# Patient Record
Sex: Female | Born: 1960 | Race: Black or African American | Hispanic: No | Marital: Married | Smoking: Never smoker
Health system: Southern US, Community
[De-identification: ages and names within clinical notes are randomized; demographics above are authoritative.]

## PROBLEM LIST (undated history)

## (undated) DIAGNOSIS — R011 Cardiac murmur, unspecified: Secondary | ICD-10-CM

## (undated) DIAGNOSIS — M1990 Unspecified osteoarthritis, unspecified site: Secondary | ICD-10-CM

## (undated) DIAGNOSIS — E079 Disorder of thyroid, unspecified: Secondary | ICD-10-CM

## (undated) DIAGNOSIS — G500 Trigeminal neuralgia: Secondary | ICD-10-CM

## (undated) DIAGNOSIS — F419 Anxiety disorder, unspecified: Secondary | ICD-10-CM

## (undated) DIAGNOSIS — E039 Hypothyroidism, unspecified: Secondary | ICD-10-CM

## (undated) DIAGNOSIS — G2581 Restless legs syndrome: Secondary | ICD-10-CM

## (undated) DIAGNOSIS — M549 Dorsalgia, unspecified: Secondary | ICD-10-CM

## (undated) HISTORY — PX: COLONOSCOPY: SHX174

## (undated) HISTORY — PX: BACK SURGERY: SHX140

## (undated) HISTORY — PX: SPINAL CORD STIMULATOR INSERTION: SHX5378

## (undated) HISTORY — DX: Unspecified osteoarthritis, unspecified site: M19.90

## (undated) HISTORY — PX: VAGINAL HYSTERECTOMY: SHX2639

---

## 2009-12-18 ENCOUNTER — Emergency Department (HOSPITAL_COMMUNITY): Admission: EM | Admit: 2009-12-18 | Discharge: 2009-12-19 | Payer: Self-pay | Admitting: Emergency Medicine

## 2010-01-23 ENCOUNTER — Emergency Department (HOSPITAL_COMMUNITY): Admission: EM | Admit: 2010-01-23 | Discharge: 2010-01-23 | Payer: Self-pay | Admitting: Emergency Medicine

## 2010-03-16 ENCOUNTER — Emergency Department (HOSPITAL_COMMUNITY)
Admission: EM | Admit: 2010-03-16 | Discharge: 2010-03-16 | Payer: Self-pay | Source: Home / Self Care | Admitting: Emergency Medicine

## 2010-06-27 ENCOUNTER — Emergency Department (HOSPITAL_COMMUNITY)
Admission: EM | Admit: 2010-06-27 | Discharge: 2010-06-27 | Disposition: A | Discharge status: Home / Self Care | Payer: Medicare HMO | Attending: Emergency Medicine | Admitting: Emergency Medicine

## 2010-06-27 DIAGNOSIS — Z79899 Other long term (current) drug therapy: Secondary | ICD-10-CM | POA: Insufficient documentation

## 2010-06-27 DIAGNOSIS — N898 Other specified noninflammatory disorders of vagina: Secondary | ICD-10-CM | POA: Insufficient documentation

## 2010-06-27 DIAGNOSIS — G8929 Other chronic pain: Secondary | ICD-10-CM | POA: Insufficient documentation

## 2010-06-27 LAB — URINALYSIS, ROUTINE W REFLEX MICROSCOPIC
Bilirubin Urine: NEGATIVE
Glucose, UA: NEGATIVE mg/dL
Nitrite: NEGATIVE
Specific Gravity, Urine: 1.034 — ABNORMAL HIGH (ref 1.005–1.030)
pH: 5.5 (ref 5.0–8.0)

## 2010-06-27 LAB — WET PREP, GENITAL
Clue Cells Wet Prep HPF POC: NONE SEEN
Trich, Wet Prep: NONE SEEN
WBC, Wet Prep HPF POC: NONE SEEN

## 2010-07-01 LAB — GC/CHLAMYDIA PROBE AMP, GENITAL: Chlamydia, DNA Probe: NEGATIVE

## 2010-12-14 ENCOUNTER — Emergency Department (HOSPITAL_COMMUNITY)
Admission: EM | Admit: 2010-12-14 | Discharge: 2010-12-14 | Disposition: A | Discharge status: Home / Self Care | Payer: Medicare HMO | Attending: Emergency Medicine | Admitting: Emergency Medicine

## 2010-12-14 DIAGNOSIS — M79609 Pain in unspecified limb: Secondary | ICD-10-CM | POA: Insufficient documentation

## 2010-12-14 DIAGNOSIS — M545 Low back pain, unspecified: Secondary | ICD-10-CM | POA: Insufficient documentation

## 2011-11-12 ENCOUNTER — Emergency Department (HOSPITAL_COMMUNITY)
Admission: EM | Admit: 2011-11-12 | Discharge: 2011-11-12 | Disposition: A | Discharge status: Home / Self Care | Payer: Medicare HMO | Attending: Emergency Medicine | Admitting: Emergency Medicine

## 2011-11-12 ENCOUNTER — Encounter (HOSPITAL_COMMUNITY): Payer: Self-pay | Admitting: Family Medicine

## 2011-11-12 DIAGNOSIS — E079 Disorder of thyroid, unspecified: Secondary | ICD-10-CM | POA: Insufficient documentation

## 2011-11-12 DIAGNOSIS — M549 Dorsalgia, unspecified: Secondary | ICD-10-CM | POA: Insufficient documentation

## 2011-11-12 DIAGNOSIS — G8929 Other chronic pain: Secondary | ICD-10-CM | POA: Insufficient documentation

## 2011-11-12 HISTORY — DX: Dorsalgia, unspecified: M54.9

## 2011-11-12 HISTORY — DX: Trigeminal neuralgia: G50.0

## 2011-11-12 HISTORY — DX: Disorder of thyroid, unspecified: E07.9

## 2011-11-12 MED ORDER — METHYLPREDNISOLONE SODIUM SUCC 125 MG IJ SOLR
125.0000 mg | Freq: Once | INTRAMUSCULAR | Status: AC
  Administered 2011-11-12: 125 mg via INTRAVENOUS
  Filled 2011-11-12: qty 2

## 2011-11-12 MED ORDER — ONDANSETRON 4 MG PO TBDP
4.0000 mg | ORAL_TABLET | Freq: Once | ORAL | Status: AC
  Administered 2011-11-12: 4 mg via ORAL
  Filled 2011-11-12: qty 1

## 2011-11-12 MED ORDER — HYDROMORPHONE HCL PF 1 MG/ML IJ SOLN
INTRAMUSCULAR | Status: AC
  Administered 2011-11-12: 1 mg via INTRAVENOUS
  Filled 2011-11-12: qty 1

## 2011-11-12 MED ORDER — MORPHINE SULFATE 4 MG/ML IJ SOLN
4.0000 mg | Freq: Once | INTRAMUSCULAR | Status: AC
  Administered 2011-11-12: 4 mg via INTRAMUSCULAR
  Filled 2011-11-12: qty 1

## 2011-11-12 MED ORDER — PREDNISONE 20 MG PO TABS: 40.0000 mg | ORAL_TABLET | Freq: Every day | ORAL | Status: DC

## 2011-11-12 MED ORDER — OXYCODONE-ACETAMINOPHEN 5-325 MG PO TABS: 1.0000 | ORAL_TABLET | Freq: Four times a day (QID) | ORAL | Status: AC | PRN

## 2011-11-12 MED ORDER — HYDROMORPHONE HCL PF 1 MG/ML IJ SOLN
1.0000 mg | Freq: Once | INTRAMUSCULAR | Status: AC
  Administered 2011-11-12: 1 mg via INTRAVENOUS

## 2011-11-12 NOTE — ED Notes (Signed)
Pt reports she has back stimulator. States she just moved here from [place].  States usually when she gets the pain to right lower back she goes to her doctor and he gives her a steroid shot and she is good, but has not been able to make it up there this time. States pain has been getting worse over the past 2 weeks.

## 2011-11-12 NOTE — ED Notes (Signed)
MD at bedside. 

## 2011-11-12 NOTE — ED Provider Notes (Signed)
History     CSN: [id number]  Arrival date & time 11/12/11  1507   First MD Initiated Contact with Patient 11/12/11 1828      Chief Complaint  Patient presents with  . Back Pain    (Consider location/radiation/quality/duration/timing/severity/associated sxs/prior treatment) HPI  Patient to the ER for back pain. She tells me that has chronic back pain due to an injury on Sept 11, 2001 as she was in the [facility] when it collapsed. She was unable to walk until after back surgery was performed. Within the past couple of years she has gotten a stimulator implant. Every 6 months she gets a steroid injection in her back. Normally they last 6 months but this time it only has lasted 4 months, She is unable to drive to [place] to see her orthopedic doctor as she is in too much pain to drive. She denies this episode of pain being worse than episodes she has had before, however, she has has been stuck at home and has not been ableto get her normal treatments. She denies nausea, vomiting, dysuria, vaginal bleedings or discharge, weakness, fevers chills and IV drug use.  Past Medical History  Diagnosis Date  . Trigeminal neuralgia   . Thyroid disease   . Back pain     Past Surgical History  Procedure Date  . Back surgery     History reviewed. No pertinent family history.  History  Substance Use Topics  . Smoking status: Never Smoker   . Smokeless tobacco: Not on file  . Alcohol Use: No    OB History    Grav Para Term Preterm Abortions TAB SAB Ect Mult Living                  Review of Systems  Unable to perform ROS   Allergies  Review of patient's allergies indicates no known allergies.  Home Medications   Current Outpatient Rx  Name Route Sig Dispense Refill  . CARBAMAZEPINE 200 MG PO TABS Oral Take 200 mg by mouth 2 (two) times daily.    Marland Kitchen GABAPENTIN 600 MG PO TABS Oral Take 600 mg by mouth 3 (three) times daily.    Marland Kitchen LEVOTHYROXINE SODIUM 25 MCG PO TABS Oral Take 25  mcg by mouth daily.    . ADULT MULTIVITAMIN W/MINERALS CH Oral Take 1 tablet by mouth daily.    Marland Kitchen PREGABALIN 100 MG PO CAPS Oral Take 100 mg by mouth 2 (two) times daily as needed. For pain.    . OXYCODONE-ACETAMINOPHEN 5-325 MG PO TABS Oral Take 1-2 tablets by mouth every 6 (six) hours as needed for pain. 15 tablet 0  . PREDNISONE 20 MG PO TABS Oral Take 2 tablets (40 mg total) by mouth daily. 14 tablet 0    BP 107/71  Pulse 69  Temp 98.2 F (36.8 C) (Oral)  Resp 18  SpO2 100%  Physical Exam  Nursing note and vitals reviewed. Constitutional: She appears well-developed and well-nourished. No distress.       Pt appears to be in significant pain  HENT:  Head: Normocephalic and atraumatic.  Eyes: Pupils are equal, round, and reactive to light.  Neck: Normal range of motion. Neck supple.  Cardiovascular: Normal rate and regular rhythm.   Pulmonary/Chest: Effort normal.  Abdominal: Soft.  Musculoskeletal:        Equal strength to bilateral lower extremities. Neurosensory function adequate to both legs. Skin color is normal. Skin is warm and moist. I see no  step off deformity, no bony tenderness. Pt is able to ambulate. Pain is relieved when sitting in certain positions. ROM is decreased due to pain. No crepitus, laceration, effusion, swelling.  Pulses are normal   Neurological: She is alert.  Skin: Skin is warm and dry.    ED Course  Procedures (including critical care time)  Labs Reviewed - No data to display No results found.   1. Chronic back pain       MDM  Pts orthopedist in [place], i have given referral for doctor in [place] as she lives [place] now.   Patient with back pain. No neurological deficits. Patient is ambulatory. No warning symptoms of back pain including: loss of bowel or bladder control, night sweats, waking from sleep with back pain, unexplained fevers or weight loss, h/o cancer, IVDU, no recent trauma. No concern for cauda equina, epidural abscess, or  other serious cause of back pain. Conservative measures such as rest, ice/heat and pain medicine indicated with PCP follow-up if no improvement with conservative management.            Dorthula Matas, PA 11/24/11 2052

## 2011-11-12 NOTE — ED Notes (Signed)
Family at bedside. 

## 2011-11-25 NOTE — ED Provider Notes (Signed)
Medical screening examination/treatment/procedure(s) were performed by non-physician practitioner and as supervising physician I was immediately available for consultation/collaboration.    Ronna Herskowitz L Keywon Mestre, MD 11/25/11 2251 

## 2012-10-25 ENCOUNTER — Other Ambulatory Visit: Payer: Self-pay | Admitting: *Deleted

## 2012-10-25 ENCOUNTER — Other Ambulatory Visit: Payer: Self-pay | Admitting: Gynecology

## 2012-10-25 DIAGNOSIS — M810 Age-related osteoporosis without current pathological fracture: Secondary | ICD-10-CM

## 2012-11-01 ENCOUNTER — Other Ambulatory Visit: Payer: Self-pay | Admitting: Gynecology

## 2012-11-01 DIAGNOSIS — M810 Age-related osteoporosis without current pathological fracture: Secondary | ICD-10-CM

## 2012-11-02 ENCOUNTER — Ambulatory Visit
Admission: RE | Admit: 2012-11-02 | Discharge: 2012-11-02 | Disposition: A | Discharge status: Home / Self Care | Payer: Private Health Insurance - Indemnity | Source: Ambulatory Visit | Attending: *Deleted | Admitting: *Deleted

## 2012-11-02 ENCOUNTER — Other Ambulatory Visit: Payer: Self-pay | Admitting: *Deleted

## 2012-11-02 DIAGNOSIS — M810 Age-related osteoporosis without current pathological fracture: Secondary | ICD-10-CM

## 2013-04-25 ENCOUNTER — Emergency Department (HOSPITAL_COMMUNITY)
Admission: EM | Admit: 2013-04-25 | Discharge: 2013-04-25 | Disposition: A | Discharge status: Home / Self Care | Payer: BC Managed Care – PPO | Attending: Emergency Medicine | Admitting: Emergency Medicine

## 2013-04-25 ENCOUNTER — Emergency Department (HOSPITAL_COMMUNITY): Payer: BC Managed Care – PPO

## 2013-04-25 ENCOUNTER — Encounter (HOSPITAL_COMMUNITY): Payer: Self-pay | Admitting: Emergency Medicine

## 2013-04-25 DIAGNOSIS — Z791 Long term (current) use of non-steroidal anti-inflammatories (NSAID): Secondary | ICD-10-CM | POA: Insufficient documentation

## 2013-04-25 DIAGNOSIS — E079 Disorder of thyroid, unspecified: Secondary | ICD-10-CM | POA: Insufficient documentation

## 2013-04-25 DIAGNOSIS — R05 Cough: Secondary | ICD-10-CM | POA: Insufficient documentation

## 2013-04-25 DIAGNOSIS — Z79899 Other long term (current) drug therapy: Secondary | ICD-10-CM | POA: Insufficient documentation

## 2013-04-25 DIAGNOSIS — G8929 Other chronic pain: Secondary | ICD-10-CM | POA: Insufficient documentation

## 2013-04-25 DIAGNOSIS — M5430 Sciatica, unspecified side: Secondary | ICD-10-CM | POA: Insufficient documentation

## 2013-04-25 DIAGNOSIS — Z9889 Other specified postprocedural states: Secondary | ICD-10-CM | POA: Insufficient documentation

## 2013-04-25 DIAGNOSIS — R059 Cough, unspecified: Secondary | ICD-10-CM | POA: Insufficient documentation

## 2013-04-25 DIAGNOSIS — J3489 Other specified disorders of nose and nasal sinuses: Secondary | ICD-10-CM | POA: Insufficient documentation

## 2013-04-25 MED ORDER — OXYCODONE-ACETAMINOPHEN 5-325 MG PO TABS
2.0000 | ORAL_TABLET | Freq: Once | ORAL | Status: AC
  Administered 2013-04-25: 2 via ORAL
  Filled 2013-04-25: qty 2

## 2013-04-25 MED ORDER — HYDROMORPHONE HCL PF 1 MG/ML IJ SOLN
1.0000 mg | Freq: Once | INTRAMUSCULAR | Status: AC
  Administered 2013-04-25: 1 mg via INTRAVENOUS
  Filled 2013-04-25: qty 1

## 2013-04-25 MED ORDER — OXYCODONE-ACETAMINOPHEN 5-325 MG PO TABS
1.0000 | ORAL_TABLET | ORAL | Status: DC | PRN
Start: 2013-04-25 — End: 2013-06-08

## 2013-04-25 MED ORDER — HYDROMORPHONE HCL PF 1 MG/ML IJ SOLN: 1.0000 mg | Freq: Once | INTRAMUSCULAR | Status: DC

## 2013-04-25 MED ORDER — ORPHENADRINE CITRATE ER 100 MG PO TB12
100.0000 mg | ORAL_TABLET | Freq: Once | ORAL | Status: AC
  Administered 2013-04-25: 100 mg via ORAL
  Filled 2013-04-25: qty 1

## 2013-04-25 MED ORDER — KETOROLAC TROMETHAMINE 30 MG/ML IJ SOLN
30.0000 mg | Freq: Once | INTRAMUSCULAR | Status: AC
  Administered 2013-04-25: 30 mg via INTRAVENOUS
  Filled 2013-04-25: qty 1

## 2013-04-25 NOTE — ED Provider Notes (Signed)
CSN: [id number]     Arrival date & time 04/25/13  1131 History   First MD Initiated Contact with Patient 04/25/13 1143     Chief Complaint  Patient presents with  . Back Pain   (Consider location/radiation/quality/duration/timing/severity/associated sxs/prior Treatment) HPI Comments: Hx of back stimulator for chronic back pain, has had several months of R hip/groin pain she reports getting steroid injections for.    Patient is a 53 y.o. female presenting with hip pain and URI. The history is provided by the patient and the spouse. No language interpreter was used.  Hip Pain This is a chronic problem. The current episode started more than 1 week ago (about 6 months, worse for 3 days). The problem occurs constantly. The problem has been gradually worsening. Pertinent negatives include no chest pain, no abdominal pain, no headaches and no shortness of breath. The symptoms are aggravated by walking, standing and bending. The symptoms are relieved by rest and lying down. Treatments tried: gabapentin, naproxen. The treatment provided mild relief.  URI Presenting symptoms: congestion, cough and rhinorrhea   Presenting symptoms: no fatigue, no fever and no sore throat   Congestion:    Location:  Nasal   Interferes with sleep: no     Interferes with eating/drinking: no   Cough:    Cough characteristics:  Non-productive   Severity:  Mild Rhinorrhea:    Quality:  Clear Severity:  Mild Duration:  3 days Timing:  Constant Progression:  Unchanged Chronicity:  New Relieved by:  Nothing Worsened by:  Nothing tried Associated symptoms: myalgias   Associated symptoms: no arthralgias, no headaches and no neck pain   Risk factors: sick contacts (with "allergy"symptoms)     Past Medical History  Diagnosis Date  . Trigeminal neuralgia   . Thyroid disease   . Back pain    Past Surgical History  Procedure Laterality Date  . Back surgery     No family history on file. History  Substance Use  Topics  . Smoking status: Never Smoker   . Smokeless tobacco: Not on file  . Alcohol Use: No   OB History   Grav Para Term Preterm Abortions TAB SAB Ect Mult Living                 Review of Systems  Constitutional: Negative for fever, chills, diaphoresis, activity change, appetite change and fatigue.  HENT: Positive for congestion and rhinorrhea. Negative for facial swelling and sore throat.   Eyes: Negative for photophobia and discharge.  Respiratory: Positive for cough. Negative for chest tightness and shortness of breath.   Cardiovascular: Negative for chest pain, palpitations and leg swelling.  Gastrointestinal: Negative for nausea, vomiting, abdominal pain and diarrhea.  Endocrine: Negative for polydipsia and polyuria.  Genitourinary: Negative for dysuria, frequency, difficulty urinating and pelvic pain.  Musculoskeletal: Positive for myalgias. Negative for arthralgias, back pain, neck pain and neck stiffness.  Skin: Negative for color change and wound.  Allergic/Immunologic: Negative for immunocompromised state.  Neurological: Positive for numbness (unchanged from chronic). Negative for facial asymmetry, weakness and headaches.  Hematological: Does not bruise/bleed easily.  Psychiatric/Behavioral: Negative for confusion and agitation.    Allergies  Dilaudid  Home Medications   Current Outpatient Rx  Name  Route  Sig  Dispense  Refill  . carbamazepine (TEGRETOL) 200 MG tablet   Oral   Take 200 mg by mouth 2 (two) times daily.         Marland Kitchen gabapentin (NEURONTIN) 600 MG tablet  Oral   Take 1,200 mg by mouth 3 (three) times daily.          Marland Kitchen. levothyroxine (SYNTHROID, LEVOTHROID) 50 MCG tablet   Oral   Take 50 mcg by mouth daily before breakfast.         . Multiple Vitamin (MULTIVITAMIN WITH MINERALS) TABS   Oral   Take 1 tablet by mouth daily.         . nabumetone (RELAFEN) 750 MG tablet   Oral   Take 750 mg by mouth 2 (two) times daily.         Marland Kitchen.  oxyCODONE-acetaminophen (PERCOCET) 5-325 MG per tablet   Oral   Take 1 tablet by mouth every 4 (four) hours as needed.   15 tablet   0   . pregabalin (LYRICA) 100 MG capsule   Oral   Take 100 mg by mouth 2 (two) times daily. For pain.          BP 114/67  Pulse 57  Temp(Src) 98.2 F (36.8 C) (Oral)  Resp 18  Wt 170 lb (77.111 kg)  SpO2 100% Physical Exam  Constitutional: She is oriented to person, place, and time. She appears well-developed and well-nourished. No distress.  HENT:  Head: Normocephalic and atraumatic.  Mouth/Throat: No oropharyngeal exudate.  Eyes: Pupils are equal, round, and reactive to light.  Neck: Normal range of motion. Neck supple.  Cardiovascular: Normal rate, regular rhythm and normal heart sounds.  Exam reveals no gallop and no friction rub.   No murmur heard. Pulmonary/Chest: Effort normal and breath sounds normal. No respiratory distress. She has no wheezes. She has no rales.  Abdominal: Soft. Bowel sounds are normal. She exhibits no distension and no mass. There is no tenderness. There is no rebound and no guarding.  Musculoskeletal: Normal range of motion. She exhibits no edema.       Right hip: She exhibits tenderness and bony tenderness. She exhibits normal range of motion, normal strength, no swelling, no deformity and no laceration.  Neurological: She is alert and oriented to person, place, and time. She has normal strength. She displays no tremor. A sensory deficit (chronic unchanged decreased sensation of L foot) is present. No cranial nerve deficit. She exhibits normal muscle tone. Coordination normal. GCS eye subscore is 4. GCS verbal subscore is 5. GCS motor subscore is 6.  Skin: Skin is warm and dry.  Psychiatric: She has a normal mood and affect.    ED Course  Procedures (including critical care time) Labs Review Labs Reviewed - No data to display Imaging Review Dg Hip Complete Right  04/25/2013   CLINICAL DATA:  Right hip pain  EXAM:  RIGHT HIP - COMPLETE 2+ VIEW  COMPARISON:  None.  FINDINGS: Three views of the right hip submitted. No acute fracture or subluxation. Joint space is preserved.  IMPRESSION: Negative.   Electronically Signed   By: Natasha MeadLiviu  Pop M.D.   On: 04/25/2013 13:17    EKG Interpretation   None       MDM   1. Sciatica    Pt is a 53 y.o. female with Pmhx as above including stimulator implant for chronic back pain, who presents with recurrent R hip pain radiating down L leg, worse w/ standing, mvmt of hip, better when still. Denies weakness, new numbness from baseline.  Has also had subjective fever, chills, body aches, cough, rhinorrhea.  She reports swelling at the hip.  No bowel or bladder incontinence.  On PE, VSS, afebrile,  in NAD.  +ttp over L hip and lateral L thigh, pain with passive & active ROM, but no weakness.  +straight leg raise. Chronic, unchanged dec sensation L foot.  Ordered XR R hip which was nml, including joint space.  Suspect sciatica based on PE.  Pt may also have mild URI or influenza-like illness, but again is afebrile and non-toxic. Doubt cauda equina, cord compression, spinal epidural abscess or acute septic arthritis.  Despite appearing to have moved here years ago, pt still has not established w/ local neurosurgeon.  She feels much improved after IV dilaudid x1, and 2 PO percocet.  Will d/c home w/ rec for outpt NSU, PCP f/u.  Return precautions given for new or worsening symptoms including fever, numbness, weakness, bowel or bladder incontinence.          Shanna Cisco, MD 04/25/13 2013

## 2013-04-25 NOTE — ED Notes (Signed)
Has a spinal cord stimulator and now has rt hip pain that rads  Pelvis hurts to bear wt shooting pain has a pain med dr but has not gotten reg dr yet

## 2013-04-25 NOTE — Discharge Instructions (Signed)
Sciatica °Sciatica is pain, weakness, numbness, or tingling along the path of the sciatic nerve. The nerve starts in the lower back and runs down the back of each leg. The nerve controls the muscles in the lower leg and in the back of the knee, while also providing sensation to the back of the thigh, lower leg, and the sole of your foot. Sciatica is a symptom of another medical condition. For instance, nerve damage or certain conditions, such as a herniated disk or bone spur on the spine, pinch or put pressure on the sciatic nerve. This causes the pain, weakness, or other sensations normally associated with sciatica. Generally, sciatica only affects one side of the body. °CAUSES  °· Herniated or slipped disc. °· Degenerative disk disease. °· A pain disorder involving the narrow muscle in the buttocks (piriformis syndrome). °· Pelvic injury or fracture. °· Pregnancy. °· Tumor (rare). °SYMPTOMS  °Symptoms can vary from mild to very severe. The symptoms usually travel from the low back to the buttocks and down the back of the leg. Symptoms can include: °· Mild tingling or dull aches in the lower back, leg, or hip. °· Numbness in the back of the calf or sole of the foot. °· Burning sensations in the lower back, leg, or hip. °· Sharp pains in the lower back, leg, or hip. °· Leg weakness. °· Severe back pain inhibiting movement. °These symptoms may get worse with coughing, sneezing, laughing, or prolonged sitting or standing. Also, being overweight may worsen symptoms. °DIAGNOSIS  °Your caregiver will perform a physical exam to look for common symptoms of sciatica. He or she may ask you to do certain movements or activities that would trigger sciatic nerve pain. Other tests may be performed to find the cause of the sciatica. These may include: °· Blood tests. °· X-rays. °· Imaging tests, such as an MRI or CT scan. °TREATMENT  °Treatment is directed at the cause of the sciatic pain. Sometimes, treatment is not necessary  and the pain and discomfort goes away on its own. If treatment is needed, your caregiver may suggest: °· Over-the-counter medicines to relieve pain. °· Prescription medicines, such as anti-inflammatory medicine, muscle relaxants, or narcotics. °· Applying heat or ice to the painful area. °· Steroid injections to lessen pain, irritation, and inflammation around the nerve. °· Reducing activity during periods of pain. °· Exercising and stretching to strengthen your abdomen and improve flexibility of your spine. Your caregiver may suggest losing weight if the extra weight makes the back pain worse. °· Physical therapy. °· Surgery to eliminate what is pressing or pinching the nerve, such as a bone spur or part of a herniated disk. °HOME CARE INSTRUCTIONS  °· Only take over-the-counter or prescription medicines for pain or discomfort as directed by your caregiver. °· Apply ice to the affected area for 20 minutes, 3 4 times a day for the first 48 72 hours. Then try heat in the same way. °· Exercise, stretch, or perform your usual activities if these do not aggravate your pain. °· Attend physical therapy sessions as directed by your caregiver. °· Keep all follow-up appointments as directed by your caregiver. °· Do not wear high heels or shoes that do not provide proper support. °· Check your mattress to see if it is too soft. A firm mattress may lessen your pain and discomfort. °SEEK IMMEDIATE MEDICAL CARE IF:  °· You lose control of your bowel or bladder (incontinence). °· You have increasing weakness in the lower back,   pelvis, buttocks, or legs. °· You have redness or swelling of your back. °· You have a burning sensation when you urinate. °· You have pain that gets worse when you lie down or awakens you at night. °· Your pain is worse than you have experienced in the past. °· Your pain is lasting longer than 4 weeks. °· You are suddenly losing weight without reason. °MAKE SURE YOU: °· Understand these  instructions. °· Will watch your condition. °· Will get help right away if you are not doing well or get worse. °Document Released: 03/09/2001 Document Revised: 09/14/2011 Document Reviewed: 07/25/2011 °[employer]® Patient Information ©2014 [employer]. ° °

## 2013-05-17 ENCOUNTER — Other Ambulatory Visit: Payer: Self-pay | Admitting: Neurosurgery

## 2013-05-17 DIAGNOSIS — M542 Cervicalgia: Secondary | ICD-10-CM

## 2013-05-17 DIAGNOSIS — M549 Dorsalgia, unspecified: Secondary | ICD-10-CM

## 2013-05-22 ENCOUNTER — Ambulatory Visit
Admission: RE | Admit: 2013-05-22 | Discharge: 2013-05-22 | Disposition: A | Discharge status: Home / Self Care | Payer: BC Managed Care – PPO | Source: Ambulatory Visit | Attending: Neurosurgery | Admitting: Neurosurgery

## 2013-05-22 VITALS — BP 137/65 | HR 66

## 2013-05-22 DIAGNOSIS — M549 Dorsalgia, unspecified: Secondary | ICD-10-CM

## 2013-05-22 DIAGNOSIS — M542 Cervicalgia: Secondary | ICD-10-CM

## 2013-05-22 MED ORDER — DIAZEPAM 5 MG PO TABS
10.0000 mg | ORAL_TABLET | Freq: Once | ORAL | Status: AC
Start: 2013-05-22 — End: 2013-05-22
  Administered 2013-05-22: 10 mg via ORAL

## 2013-05-22 MED ORDER — HYDROMORPHONE HCL PF 2 MG/ML IJ SOLN
1.5000 mg | Freq: Once | INTRAMUSCULAR | Status: AC
  Administered 2013-05-22: 1.5 mg via INTRAMUSCULAR

## 2013-05-22 MED ORDER — ONDANSETRON HCL 4 MG/2ML IJ SOLN
4.0000 mg | Freq: Once | INTRAMUSCULAR | Status: AC
  Administered 2013-05-22: 4 mg via INTRAMUSCULAR

## 2013-05-22 MED ORDER — IOHEXOL 300 MG/ML  SOLN
10.0000 mL | Freq: Once | INTRAMUSCULAR | Status: AC | PRN
  Administered 2013-05-22: 10 mL via INTRATHECAL

## 2013-05-22 NOTE — Discharge Instructions (Signed)

## 2013-06-08 ENCOUNTER — Emergency Department (HOSPITAL_COMMUNITY)
Admission: EM | Admit: 2013-06-08 | Discharge: 2013-06-09 | Disposition: A | Discharge status: Home / Self Care | Payer: BC Managed Care – PPO | Attending: Emergency Medicine | Admitting: Emergency Medicine

## 2013-06-08 ENCOUNTER — Encounter (HOSPITAL_COMMUNITY): Payer: Self-pay | Admitting: Emergency Medicine

## 2013-06-08 DIAGNOSIS — M545 Low back pain, unspecified: Secondary | ICD-10-CM | POA: Insufficient documentation

## 2013-06-08 DIAGNOSIS — M546 Pain in thoracic spine: Secondary | ICD-10-CM | POA: Insufficient documentation

## 2013-06-08 DIAGNOSIS — R52 Pain, unspecified: Secondary | ICD-10-CM | POA: Insufficient documentation

## 2013-06-08 DIAGNOSIS — M549 Dorsalgia, unspecified: Secondary | ICD-10-CM

## 2013-06-08 DIAGNOSIS — R079 Chest pain, unspecified: Secondary | ICD-10-CM | POA: Insufficient documentation

## 2013-06-08 DIAGNOSIS — M7989 Other specified soft tissue disorders: Secondary | ICD-10-CM | POA: Insufficient documentation

## 2013-06-08 DIAGNOSIS — G8929 Other chronic pain: Secondary | ICD-10-CM | POA: Insufficient documentation

## 2013-06-08 DIAGNOSIS — Z8669 Personal history of other diseases of the nervous system and sense organs: Secondary | ICD-10-CM | POA: Insufficient documentation

## 2013-06-08 DIAGNOSIS — Z9889 Other specified postprocedural states: Secondary | ICD-10-CM | POA: Insufficient documentation

## 2013-06-08 DIAGNOSIS — E079 Disorder of thyroid, unspecified: Secondary | ICD-10-CM | POA: Insufficient documentation

## 2013-06-08 DIAGNOSIS — Z79899 Other long term (current) drug therapy: Secondary | ICD-10-CM | POA: Insufficient documentation

## 2013-06-08 NOTE — ED Notes (Signed)
Pt has a spine stimulator since 2003 and she is on emergency wait list for surgery with Dr Lovell SheehanJenkins,  He did a myelogram study on her back.  Pt states for two days since the weather has been damp her back has hurt more than usual and more so on right side and hurts on right side to take a deep breath

## 2013-06-09 ENCOUNTER — Emergency Department (HOSPITAL_COMMUNITY): Payer: BC Managed Care – PPO

## 2013-06-09 LAB — CBC WITH DIFFERENTIAL/PLATELET
Basophils Absolute: 0 10*3/uL (ref 0.0–0.1)
Basophils Relative: 0 % (ref 0–1)
Eosinophils Absolute: 0.2 10*3/uL (ref 0.0–0.7)
Eosinophils Relative: 2 % (ref 0–5)
HEMATOCRIT: 38.2 % (ref 36.0–46.0)
HEMOGLOBIN: 12.6 g/dL (ref 12.0–15.0)
LYMPHS PCT: 56 % — AB (ref 12–46)
Lymphs Abs: 4 10*3/uL (ref 0.7–4.0)
MCH: 32.2 pg (ref 26.0–34.0)
MCHC: 33 g/dL (ref 30.0–36.0)
MCV: 97.7 fL (ref 78.0–100.0)
MONO ABS: 0.4 10*3/uL (ref 0.1–1.0)
MONOS PCT: 5 % (ref 3–12)
NEUTROS ABS: 2.7 10*3/uL (ref 1.7–7.7)
Neutrophils Relative %: 37 % — ABNORMAL LOW (ref 43–77)
Platelets: 264 10*3/uL (ref 150–400)
RBC: 3.91 MIL/uL (ref 3.87–5.11)
RDW: 13.3 % (ref 11.5–15.5)
WBC: 7.2 10*3/uL (ref 4.0–10.5)

## 2013-06-09 LAB — BASIC METABOLIC PANEL
BUN: 16 mg/dL (ref 6–23)
CHLORIDE: 103 meq/L (ref 96–112)
CO2: 27 meq/L (ref 19–32)
CREATININE: 0.9 mg/dL (ref 0.50–1.10)
Calcium: 9.3 mg/dL (ref 8.4–10.5)
GFR calc non Af Amer: 72 mL/min — ABNORMAL LOW (ref >90)
GFR, EST AFRICAN AMERICAN: 84 mL/min — AB (ref >90)
Glucose, Bld: 88 mg/dL (ref 70–99)
Potassium: 4.7 mEq/L (ref 3.7–5.3)
Sodium: 142 mEq/L (ref 137–147)

## 2013-06-09 LAB — D-DIMER, QUANTITATIVE (NOT AT ARMC)

## 2013-06-09 MED ORDER — METHOCARBAMOL 750 MG PO TABS: 750.0000 mg | ORAL_TABLET | Freq: Four times a day (QID) | ORAL | Status: DC

## 2013-06-09 MED ORDER — HYDROMORPHONE HCL PF 1 MG/ML IJ SOLN
1.0000 mg | Freq: Once | INTRAMUSCULAR | Status: AC
  Administered 2013-06-09: 1 mg via INTRAMUSCULAR
  Filled 2013-06-09: qty 1

## 2013-06-09 MED ORDER — OXYCODONE-ACETAMINOPHEN 5-325 MG PO TABS: 2.0000 | ORAL_TABLET | ORAL | Status: DC | PRN

## 2013-06-09 MED ORDER — METHOCARBAMOL 500 MG PO TABS
1000.0000 mg | ORAL_TABLET | Freq: Once | ORAL | Status: AC
  Administered 2013-06-09: 1000 mg via ORAL
  Filled 2013-06-09: qty 2

## 2013-06-09 NOTE — ED Provider Notes (Signed)
CSN: [id number]     Arrival date & time 06/08/13  2213 History   First MD Initiated Contact with Patient 06/08/13 2303     Chief Complaint  Patient presents with  . Back Pain     (Consider location/radiation/quality/duration/timing/severity/associated sxs/prior Treatment) HPI 53 year old female presents to emergency apartment with complaint of right lower back pain ongoing for some time, but worsened over last 2 days.  Patient also reports that she is having right upper back pain, just under the scapula.  She reports when taking a deep breath, the pain is worse.  She denies dyspnea, but does report that she is having to think about taking shallower breaths.  She is complaining of swelling to her right leg from hip down to her toes.  She reports this is secondary to the bursitis, that she's been diagnosed with.  Patient recently had a myelogram done through her neurosurgeon, Dr. Lovell SheehanJenkins.  She is awaiting a followup appointment to discuss the results and planning a surgery.  She has previous surgery with spinal stimulator placed some time ago.  Myelogram reviewed showing she has spinal stenosis at T9 and T10 at the level of the spinal stimulator.  No fever, chills, cough.  No prior history of PE or DVT. Past Medical History  Diagnosis Date  . Trigeminal neuralgia   . Thyroid disease   . Back pain    Past Surgical History  Procedure Laterality Date  . Back surgery     History reviewed. No pertinent family history. History  Substance Use Topics  . Smoking status: Never Smoker   . Smokeless tobacco: Not on file  . Alcohol Use: No   OB History   Grav Para Term Preterm Abortions TAB SAB Ect Mult Living                 Review of Systems   See History of Present Illness; otherwise all other systems are reviewed and negative  Allergies  Review of patient's allergies indicates no known allergies.  Home Medications   Current Outpatient Rx  Name  Route  Sig  Dispense  Refill  .  carbamazepine (TEGRETOL) 200 MG tablet   Oral   Take 200 mg by mouth at bedtime.          . gabapentin (NEURONTIN) 600 MG tablet   Oral   Take 1,200 mg by mouth 3 (three) times daily.          Marland Kitchen. levothyroxine (SYNTHROID, LEVOTHROID) 50 MCG tablet   Oral   Take 50 mcg by mouth daily before breakfast.         . Multiple Vitamin (MULTIVITAMIN WITH MINERALS) TABS   Oral   Take 1 tablet by mouth every morning.          . nabumetone (RELAFEN) 750 MG tablet   Oral   Take 750 mg by mouth 2 (two) times daily.          BP 134/82  Pulse 74  Temp(Src) 98.1 F (36.7 C) (Oral)  Resp 18  SpO2 99% Physical Exam  Nursing note and vitals reviewed. Constitutional: She is oriented to person, place, and time. She appears well-developed and well-nourished.  HENT:  Head: Normocephalic and atraumatic.  Nose: Nose normal.  Mouth/Throat: Oropharynx is clear and moist.  Eyes: Conjunctivae and EOM are normal. Pupils are equal, round, and reactive to light.  Neck: Normal range of motion. Neck supple. No JVD present. No tracheal deviation present. No thyromegaly present.  Cardiovascular: Normal  rate, regular rhythm, normal heart sounds and intact distal pulses.  Exam reveals no gallop and no friction rub.   No murmur heard. Pulmonary/Chest: Effort normal and breath sounds normal. No stridor. No respiratory distress. She has no wheezes. She has no rales. She exhibits tenderness (patient has point tenderness under her right scapula).  Abdominal: Soft. Bowel sounds are normal. She exhibits no distension and no mass. There is no tenderness. There is no rebound and no guarding.  Musculoskeletal: Normal range of motion. She exhibits tenderness (tenderness to palpation to right lower back). She exhibits no edema.  Lymphadenopathy:    She has no cervical adenopathy.  Neurological: She is alert and oriented to person, place, and time. She has normal reflexes. No cranial nerve deficit. She exhibits  normal muscle tone. Coordination normal.  Skin: Skin is warm and dry. No rash noted. No pallor.  Psychiatric: She has a normal mood and affect. Her behavior is normal. Judgment and thought content normal.    ED Course  Procedures (including critical care time) Labs Review Labs Reviewed  CBC WITH DIFFERENTIAL - Abnormal; Notable for the following:    Neutrophils Relative % 37 (*)    Lymphocytes Relative 56 (*)    All other components within normal limits  BASIC METABOLIC PANEL - Abnormal; Notable for the following:    GFR calc non Af Amer 72 (*)    GFR calc Af Amer 84 (*)    All other components within normal limits  D-DIMER, QUANTITATIVE   Imaging Review Dg Chest 2 View  06/09/2013   CLINICAL DATA:  Chest pain with inspiration.  EXAM: CHEST  2 VIEW  COMPARISON:  None available for comparison at time of study interpretation.  FINDINGS: Cardiomediastinal silhouette is unremarkable. The lungs are clear without pleural effusions or focal consolidations. Trachea projects midline and there is no pneumothorax. Soft tissue planes and included osseous structures are non-suspicious. Spinal stimulator lead projects in mid thoracic spine, within the posterior canal on the lateral radiograph.  IMPRESSION: No acute cardiopulmonary process.   Electronically Signed   By: Awilda Metro   On: 06/09/2013 00:57     EKG Interpretation None      MDM   Final diagnoses:  Chronic back pain  Upper back pain on right side    53 year old female with acute on chronic right lower back pain, but has new pain in pain with deep breaths, right posterior chest.  It appears to be musculoskeletal, but concern for PE as patient is not as mobile as normal.  Plan to get some baseline labs, and d-dimer.    Olivia Mackie, MD 06/09/13 [phone]

## 2013-06-09 NOTE — Discharge Instructions (Signed)
Back Pain, Adult Low back pain is very common. About 1 in 5 people have back pain.The cause of low back pain is rarely dangerous. The pain often gets better over time.About half of people with a sudden onset of back pain feel better in just 2 weeks. About 8 in 10 people feel better by 6 weeks.  CAUSES Some common causes of back pain include:  Strain of the muscles or ligaments supporting the spine.  Wear and tear (degeneration) of the spinal discs.  Arthritis.  Direct injury to the back. DIAGNOSIS Most of the time, the direct cause of low back pain is not known.However, back pain can be treated effectively even when the exact cause of the pain is unknown.Answering your caregiver's questions about your overall health and symptoms is one of the most accurate ways to make sure the cause of your pain is not dangerous. If your caregiver needs more information, he or she may order lab work or imaging tests (X-rays or MRIs).However, even if imaging tests show changes in your back, this usually does not require surgery. HOME CARE INSTRUCTIONS For many people, back pain returns.Since low back pain is rarely dangerous, it is often a condition that people can learn to manageon their own.   Remain active. It is stressful on the back to sit or stand in one place. Do not sit, drive, or stand in one place for more than 30 minutes at a time. Take short walks on level surfaces as soon as pain allows.Try to increase the length of time you walk each day.  Do not stay in bed.Resting more than 1 or 2 days can delay your recovery.  Do not avoid exercise or work.Your body is made to move.It is not dangerous to be active, even though your back may hurt.Your back will likely heal faster if you return to being active before your pain is gone.  Pay attention to your body when you bend and lift. Many people have less discomfortwhen lifting if they bend their knees, keep the load close to their bodies,and  avoid twisting. Often, the most comfortable positions are those that put less stress on your recovering back.  Find a comfortable position to sleep. Use a firm mattress and lie on your side with your knees slightly bent. If you lie on your back, put a pillow under your knees.  Only take over-the-counter or prescription medicines as directed by your caregiver. Over-the-counter medicines to reduce pain and inflammation are often the most helpful.Your caregiver may prescribe muscle relaxant drugs.These medicines help dull your pain so you can more quickly return to your normal activities and healthy exercise.  Put ice on the injured area.  Put ice in a plastic bag.  Place a towel between your skin and the bag.  Leave the ice on for 15-20 minutes, 03-04 times a day for the first 2 to 3 days. After that, ice and heat may be alternated to reduce pain and spasms.  Ask your caregiver about trying back exercises and gentle massage. This may be of some benefit.  Avoid feeling anxious or stressed.Stress increases muscle tension and can worsen back pain.It is important to recognize when you are anxious or stressed and learn ways to manage it.Exercise is a great option. SEEK MEDICAL CARE IF:  You have pain that is not relieved with rest or medicine.  You have pain that does not improve in 1 week.  You have new symptoms.  You are generally not feeling well. SEEK   IMMEDIATE MEDICAL CARE IF:   You have pain that radiates from your back into your legs.  You develop new bowel or bladder control problems.  You have unusual weakness or numbness in your arms or legs.  You develop nausea or vomiting.  You develop abdominal pain.  You feel faint. Document Released: 03/15/2005 Document Revised: 09/14/2011 Document Reviewed: 08/03/2010 Exit[employer] Patient Information 2014 [employer].  

## 2013-07-05 ENCOUNTER — Encounter: Payer: Self-pay | Admitting: *Deleted

## 2013-07-06 ENCOUNTER — Encounter: Payer: Self-pay | Admitting: Neurology

## 2013-07-06 ENCOUNTER — Encounter (INDEPENDENT_AMBULATORY_CARE_PROVIDER_SITE_OTHER): Payer: Self-pay

## 2013-07-06 ENCOUNTER — Ambulatory Visit (INDEPENDENT_AMBULATORY_CARE_PROVIDER_SITE_OTHER): Payer: BC Managed Care – PPO | Admitting: Neurology

## 2013-07-06 VITALS — BP 119/81 | HR 77 | Temp 98.1°F | Ht 65.5 in | Wt 188.0 lb

## 2013-07-06 DIAGNOSIS — G8929 Other chronic pain: Secondary | ICD-10-CM

## 2013-07-06 DIAGNOSIS — G500 Trigeminal neuralgia: Secondary | ICD-10-CM

## 2013-07-06 MED ORDER — CARBAMAZEPINE 200 MG PO TABS: 200.0000 mg | ORAL_TABLET | Freq: Two times a day (BID) | ORAL | Status: DC

## 2013-07-06 NOTE — Progress Notes (Signed)
[facility]    Provider:  Dr Hosie PoissonSumner Referring Provider: Jackie Plumsei-Bonsu, George, MD Primary Care Physician:  Betty PlumSEI-BONSU,GEORGE, MD  CC:  Trigeminal neuralgia  HPI:  Betty NimsBarbara Mejia is a [age] y.o. female here as a referral from Dr. Julio Sickssei-Bonsu for trigeminal neuralgia  Symptoms started around 15 years ago, is on the left side. Currently taking tegretol 200mg  twice a day for this condition. Has been on a stable dose of tegretol for years. Notes her symptoms can be triggered by the wind, chewing, hot or cold food. Can tell symptoms are coming on due to twitching of her left eye. If she feels it coming on will take an extra tegretol and that usually calms her symptoms down. Last flare up was this past winter when she was up in [place], symptoms were resolved by dissolving a tegretol. Tolerating the tegretol well, no dizziness, mild fatigue.   Has a spinal stimulator for chronic pain, has given good benefit. 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 Is currently working with a pain management doctor for further management.   Prior neurologist was Dr Betty Mejia in [place], number is [phone]. Recently moved to this area.   Review of Systems: Out of a complete 14 system review, the patient complains of only the following symptoms, and all other reviewed systems are negative. + eye pain, easy bruising, feeling hot, feeling cold, pain  History   Social History  . Marital Status: Married    Spouse Name: Iantha FallenKenneth    Number of Children: 3  . Years of Education: 12   Occupational History  .      disabled   Social History Main Topics  . Smoking status: Never Smoker   . Smokeless tobacco: Never Used  . Alcohol Use: No  . Drug Use: No  . Sexual Activity: Not on file   Other Topics Concern  . Not on file   Social History Narrative   Patient is right handed and resides with son    Family History  Problem Relation Age of Onset  . Hypertension Mother   . Cancer Father    . Cancer Maternal Grandmother     uterus    Past Medical History  Diagnosis Date  . Trigeminal neuralgia   . Thyroid disease   . Back pain   . Arthritis     Past Surgical History  Procedure Laterality Date  . Back surgery      stimulator in back  . Vaginal hysterectomy      Current Outpatient Prescriptions  Medication Sig Dispense Refill  . carbamazepine (TEGRETOL) 200 MG tablet Take 200 mg by mouth at bedtime.       Marland Kitchen. DICLOFENAC SODIUM PO Take 75 mg by mouth 2 (two) times daily.      . DULoxetine (CYMBALTA) 30 MG capsule Take 30 mg by mouth 2 (two) times daily.      Marland Kitchen. gabapentin (NEURONTIN) 600 MG tablet Take 1,200 mg by mouth 3 (three) times daily.       Marland Kitchen. levothyroxine (SYNTHROID, LEVOTHROID) 75 MCG tablet Take 75 mcg by mouth daily before breakfast.      . Multiple Vitamin (MULTIVITAMIN WITH MINERALS) TABS Take 1 tablet by mouth every morning.       . nabumetone (RELAFEN) 750 MG tablet Take 750 mg by mouth 2 (two) times daily.      Marland Kitchen. oxyCODONE-acetaminophen (PERCOCET/ROXICET) 5-325 MG per tablet Take 2 tablets by mouth every 4 (four) hours as needed for severe pain.  20 tablet  0   No current facility-administered medications for this visit.    Allergies as of 07/06/2013  . (No Known Allergies)    Vitals: BP 119/81  Pulse 77  Temp(Src) 98.1 F (36.7 C) (Oral)  Ht 5' 5.5" (1.664 m)  Wt 188 lb (85.276 kg)  BMI 30.80 kg/m2 Last Weight:  Wt Readings from Last 1 Encounters:  07/06/13 188 lb (85.276 kg)   Last Height:   Ht Readings from Last 1 Encounters:  07/06/13 5' 5.5" (1.664 m)     Physical exam: Exam: Gen: NAD, conversant Eyes: anicteric sclerae, moist conjunctivae HENT: Atraumatic, oropharynx clear Neck: Trachea midline; supple,  Lungs: CTA, no wheezing, rales, rhonic                          CV: RRR, no MRG Abdomen: Soft, non-tender;  Extremities: No peripheral edema  Skin: Normal temperature, no rash,  Psych: Appropriate affect,  pleasant  Neuro: MS: AA&Ox3, appropriately interactive, normal affect   Attention: WORLD backwards  Speech: fluent w/o paraphasic error  Memory: good recent and remote recall  CN: PERRL, EOMI no nystagmus, no ptosis, sensation intact to LT V1-V3 bilat, face symmetric, no weakness, hearing grossly intact, palate elevates symmetrically, shoulder shrug 5/5 bilat,  tongue protrudes midline, no fasiculations noted.  Motor: normal bulk and tone Strength: Pain related weakness in proximal LUE and RLE (RLE 3/5 but patient able to ambulate)  Coord: rapid alternating and point-to-point (FNF, HTS) movements intact.  Reflexes: symmetrical, bilat downgoing toes  Sens: LT intact in all extremities  Gait: posture, stance, stride and arm-swing normal. Tandem gait intact. Able to walk on heels and toes. Romberg absent.   Assessment:  After physical and neurologic examination, review of laboratory studies, imaging, neurophysiology testing and pre-existing records, assessment will be reviewed on the problem list.  Plan:  Treatment plan and additional workup will be reviewed under Problem List.  1)Trigeminal neuralgia 2)Chronic pain  53y/o woman recently moved to [place] presenting for initial evaluation and transition of care. She has a long history of trigeminal neuralgia which has been well controlled with tegretol 200mg  BID and a prn dose as needed. No active issues at this time. Will give refill of tegretol, follow up in 6 months or as needed.   Elspeth Cho, DO  [facility] [address] [place], [place] [address]  Phone [phone] Fax [phone]

## 2013-10-22 ENCOUNTER — Telehealth: Payer: Self-pay | Admitting: Neurology

## 2013-10-22 NOTE — Telephone Encounter (Signed)
Patient requesting an earlier appointment with Dr. Hosie PoissonSumner (10/12).  Pt's experiencing more pain and it feels like shock.  Please call and advise.

## 2013-10-22 NOTE — Telephone Encounter (Signed)
Patient calling wanting a sooner appt due to pain. Spoke to patient and she is aware that the physician is out the office right now but will return in the morning.

## 2013-10-23 ENCOUNTER — Encounter: Payer: Self-pay | Admitting: Nurse Practitioner

## 2013-10-23 ENCOUNTER — Ambulatory Visit (INDEPENDENT_AMBULATORY_CARE_PROVIDER_SITE_OTHER): Payer: BC Managed Care – PPO | Admitting: Nurse Practitioner

## 2013-10-23 VITALS — BP 109/71 | HR 80 | Ht 65.5 in | Wt 186.6 lb

## 2013-10-23 DIAGNOSIS — Z5181 Encounter for therapeutic drug level monitoring: Secondary | ICD-10-CM

## 2013-10-23 DIAGNOSIS — G500 Trigeminal neuralgia: Secondary | ICD-10-CM

## 2013-10-23 MED ORDER — CARBAMAZEPINE 200 MG PO TABS: 300.0000 mg | ORAL_TABLET | Freq: Two times a day (BID) | ORAL | Status: DC

## 2013-10-23 NOTE — Patient Instructions (Signed)
Increase Tegretol to 1.5 tabs twice daily Will get CBZ level today Followup in 4-5 months

## 2013-10-23 NOTE — Telephone Encounter (Signed)
Can you try to get her into the open slot Betty JonesCarolyn has tomorrow (9:30 on 7/29). If not I can see her in one of my new patient slots this week. Thanks.

## 2013-10-23 NOTE — Telephone Encounter (Signed)
Called patient and scheduled her with NP CM on 10/23/13 at 3:30 pm.

## 2013-10-23 NOTE — Progress Notes (Signed)
[facility]  PATIENT: Betty Mejia DOB: 07-30-1960   REASON FOR VISIT: Followup for trigeminal neuralgia   HISTORY OF PRESENT ILLNESS: Betty Mejia, 53 year old female returns for followup. She was in the office initially and seen by Dr. Hosie Poisson 07/06/2013. She has a history of trigeminal neuralgia which has been well-controlled on 200 mg twice daily until recently. The left facial area including the eye is more painful in the last week or so. Her symptoms may be triggered by ice or cold food talking on the phone and  chewing. Her last flare was in the winter. She denies side effects to the Tegretol, no dizziness no balance issues. She also sees pain management for chronic back pain. She returns for reevaluation  HISTORY: Betty Mejia is a 53 y.o. female here as a referral from Dr. Julio Sicks for trigeminal neuralgia  Symptoms started around 15 years ago, is on the left side. Currently taking tegretol 200mg  twice a day for this condition. Has been on a stable dose of tegretol for years. Notes her symptoms can be triggered by the wind, chewing, hot or cold food. Can tell symptoms are coming on due to twitching of her left eye. If she feels it coming on will take an extra tegretol and that usually calms her symptoms down. Last flare up was this past winter when she was up in [place], symptoms were resolved by dissolving a tegretol. Tolerating the tegretol well, no dizziness, mild fatigue.  Has a spinal stimulator for chronic pain, has given good benefit. 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 Is currently working with a pain management doctor for further management.  Prior neurologist was Dr Zenia Resides in [place], number is [phone]. Recently moved to this area.   REVIEW OF SYSTEMS: Full 14 system review of systems performed and notable only for those listed, all others are neg:  Constitutional: N/A  Cardiovascular: Leg swelling  Ear/Nose/Throat: N/A  Skin: N/A    Eyes: Blurred vision  Respiratory: N/A  Gastroitestinal: N/A  Hematology/Lymphatic: N/A  Endocrine: N/A Musculoskeletal: Joint pain joint swelling, back pain muscle cramps, neck pain Allergy/Immunology: N/A  Neurological: Weakness  Psychiatric: N/A Sleep : NA   ALLERGIES: No Known Allergies  HOME MEDICATIONS: Outpatient Prescriptions Prior to Visit  Medication Sig Dispense Refill  . carbamazepine (TEGRETOL) 200 MG tablet Take 1 tablet (200 mg total) by mouth 2 (two) times daily.  60 tablet  6  . DULoxetine (CYMBALTA) 30 MG capsule Take 30 mg by mouth 2 (two) times daily.      Marland Kitchen gabapentin (NEURONTIN) 600 MG tablet Take 1,200 mg by mouth 3 (three) times daily.       Marland Kitchen levothyroxine (SYNTHROID, LEVOTHROID) 75 MCG tablet Take 75 mcg by mouth daily before breakfast.      . Multiple Vitamin (MULTIVITAMIN WITH MINERALS) TABS Take 1 tablet by mouth every morning.       . nabumetone (RELAFEN) 750 MG tablet Take 750 mg by mouth 2 (two) times daily.      Marland Kitchen oxyCODONE-acetaminophen (PERCOCET/ROXICET) 5-325 MG per tablet Take 2 tablets by mouth every 4 (four) hours as needed for severe pain.  20 tablet  0  . DICLOFENAC SODIUM PO Take 75 mg by mouth 2 (two) times daily.       No facility-administered medications prior to visit.    PAST MEDICAL HISTORY: Past Medical History  Diagnosis Date  . Trigeminal neuralgia   . Thyroid disease   . Back pain   . Arthritis  PAST SURGICAL HISTORY: Past Surgical History  Procedure Laterality Date  . Back surgery      stimulator in back  . Vaginal hysterectomy      FAMILY HISTORY: Family History  Problem Relation Age of Onset  . Hypertension Mother   . Cancer Father   . Cancer Maternal Grandmother     uterus    SOCIAL HISTORY: History   Social History  . Marital Status: Married    Spouse Name: Iantha FallenKenneth    Number of Children: 3  . Years of Education: 12   Occupational History  .      disabled   Social History Main Topics  .  Smoking status: Never Smoker   . Smokeless tobacco: Never Used  . Alcohol Use: No  . Drug Use: No  . Sexual Activity: Not on file   Other Topics Concern  . Not on file   Social History Narrative   Patient is married with 3 children   Patient is right handed   Patient has high school education   Patient denies daily caffeine     PHYSICAL EXAM  Filed Vitals:   10/23/13 1500  BP: 109/71  Pulse: 80  Height: 5' 5.5" (1.664 m)  Weight: 186 lb 9.6 oz (84.641 kg)   Body mass index is 30.57 kg/(m^2).  Generalized: Well developed, in no acute distress  Head: normocephalic and atraumatic,. Oropharynx benign  Neck: Supple, no carotid bruits  Cardiac: Regular rate rhythm, no murmur  Musculoskeletal: No deformity   Neurological examination   Mentation: Alert oriented to time, place, history taking. Follows all commands speech and language fluent  Cranial nerve II-XII: Pupils were equal round reactive to light extraocular movements were full, visual field were full on confrontational test. Facial sensation and strength were normal. hearing was intact to finger rubbing bilaterally. Uvula tongue midline. head turning and shoulder shrug were normal and symmetric.Tongue protrusion into cheek strength was normal. Motor: normal bulk and tone, full strength in the BUE, BLE, fine finger movements normal, no pronator drift. No focal weakness Sensory: normal and symmetric to light touch, pinprick, and  vibration  Coordination: finger-nose-finger, heel-to-shin bilaterally, no dysmetria Reflexes: Brachioradialis 2/2, biceps 2/2, triceps 2/2, patellar 2/2, Achilles 2/2, plantar responses were flexor bilaterally. Gait and Station: Rising up from seated position without assistance, normal stance,  moderate stride, good arm swing, smooth turning, able to perform tiptoe, and heel walking without difficulty. Tandem gait is steady  DIAGNOSTIC DATA (LABS, IMAGING, TESTING) - I reviewed patient records,  labs, notes, testing and imaging myself where available.  Lab Results  Component Value Date   WBC 7.2 06/09/2013   HGB 12.6 06/09/2013   HCT 38.2 06/09/2013   MCV 97.7 06/09/2013   PLT 264 06/09/2013      Component Value Date/Time   NA 142 06/09/2013 0027   K 4.7 06/09/2013 0027   CL 103 06/09/2013 0027   CO2 27 06/09/2013 0027   GLUCOSE 88 06/09/2013 0027   BUN 16 06/09/2013 0027   CREATININE 0.90 06/09/2013 0027   CALCIUM 9.3 06/09/2013 0027   GFRNONAA 72* 06/09/2013 0027   GFRAA 84* 06/09/2013 0027    ASSESSMENT AND PLAN  53 y.o. year old female  has a past medical history of Trigeminal neuralgia; ; Back pain; and Arthritis. here to followup. Her back pain is followed by pain management  Increase Tegretol to 1.5 tabs twice daily Will get CBZ level today Followup in 4-5 months Nilda RiggsNancy Carolyn Asaf Elmquist, GNP, [facility],  APRN  [facility] [address] [place], [place] [address] [phone]

## 2013-10-24 LAB — CARBAMAZEPINE LEVEL, TOTAL: Carbamazepine Lvl: 2.2 ug/mL — ABNORMAL LOW (ref 4.0–12.0)

## 2013-10-24 NOTE — Progress Notes (Signed)
Quick Note:  I called and gave the results of the labs to pt. She will take the tegretol 300mg  po bid (1.5 tabs of 200mg  tablets po bid). Pt verbalized understanding. Confirmed 02-27-14 appt. ______

## 2013-11-12 ENCOUNTER — Telehealth: Payer: Self-pay | Admitting: Neurology

## 2013-11-12 MED ORDER — CARBAMAZEPINE 200 MG PO TABS: 300.0000 mg | ORAL_TABLET | Freq: Two times a day (BID) | ORAL | Status: DC

## 2013-11-12 NOTE — Telephone Encounter (Signed)
This Rx was already sent to [facility] on 07/28.  I resent the Rx again.

## 2013-11-12 NOTE — Telephone Encounter (Signed)
Patient stated Dr. Hosie PoissonSumner forward Rx refill of carbamazepine (TEGRETOL) 200 MG tablet to [facility]. Patients stated Rx refill should have went to [facility], Rx bin [phone], [facility], [facility], [place] # [id number], Insurer: Yolonda KidaKenneth Shuler, and [facility] telephone # [phone].  Thanks

## 2013-11-13 ENCOUNTER — Telehealth: Payer: Self-pay | Admitting: Nurse Practitioner

## 2013-11-13 NOTE — Telephone Encounter (Signed)
We have confirmation the pharmacy received the Rx yesterday.  It usually takes them 48-72 hours to enter the info in their system.  I called the patient back.  She is aware.

## 2013-11-13 NOTE — Telephone Encounter (Signed)
Patient calling to state that her mail order pharmacy never received the script for carbamazepine. Please return call and advise.

## 2014-01-07 ENCOUNTER — Ambulatory Visit: Payer: BC Managed Care – PPO | Admitting: Neurology

## 2014-01-31 ENCOUNTER — Other Ambulatory Visit: Payer: Self-pay | Admitting: Orthopedic Surgery

## 2014-01-31 DIAGNOSIS — M25551 Pain in right hip: Secondary | ICD-10-CM

## 2014-02-12 ENCOUNTER — Ambulatory Visit
Admission: RE | Admit: 2014-02-12 | Discharge: 2014-02-12 | Disposition: A | Discharge status: Home / Self Care | Payer: BC Managed Care – PPO | Source: Ambulatory Visit | Attending: Orthopedic Surgery | Admitting: Orthopedic Surgery

## 2014-02-12 DIAGNOSIS — M25551 Pain in right hip: Secondary | ICD-10-CM

## 2014-02-12 MED ORDER — IOHEXOL 180 MG/ML  SOLN
15.0000 mL | Freq: Once | INTRAMUSCULAR | Status: AC | PRN
  Administered 2014-02-12: 15 mL via INTRA_ARTICULAR

## 2014-02-27 ENCOUNTER — Ambulatory Visit: Payer: BC Managed Care – PPO | Admitting: Nurse Practitioner

## 2014-03-07 ENCOUNTER — Ambulatory Visit: Payer: BC Managed Care – PPO | Admitting: Nurse Practitioner

## 2014-04-24 IMAGING — CR DG CHEST 2V
2 series · 2 of 2 positions shown · non-contrast
Comparison: None available for comparison at time of study
interpretation.

CLINICAL DATA: Chest pain with inspiration.

EXAM:
CHEST  2 VIEW

[w chest pa]
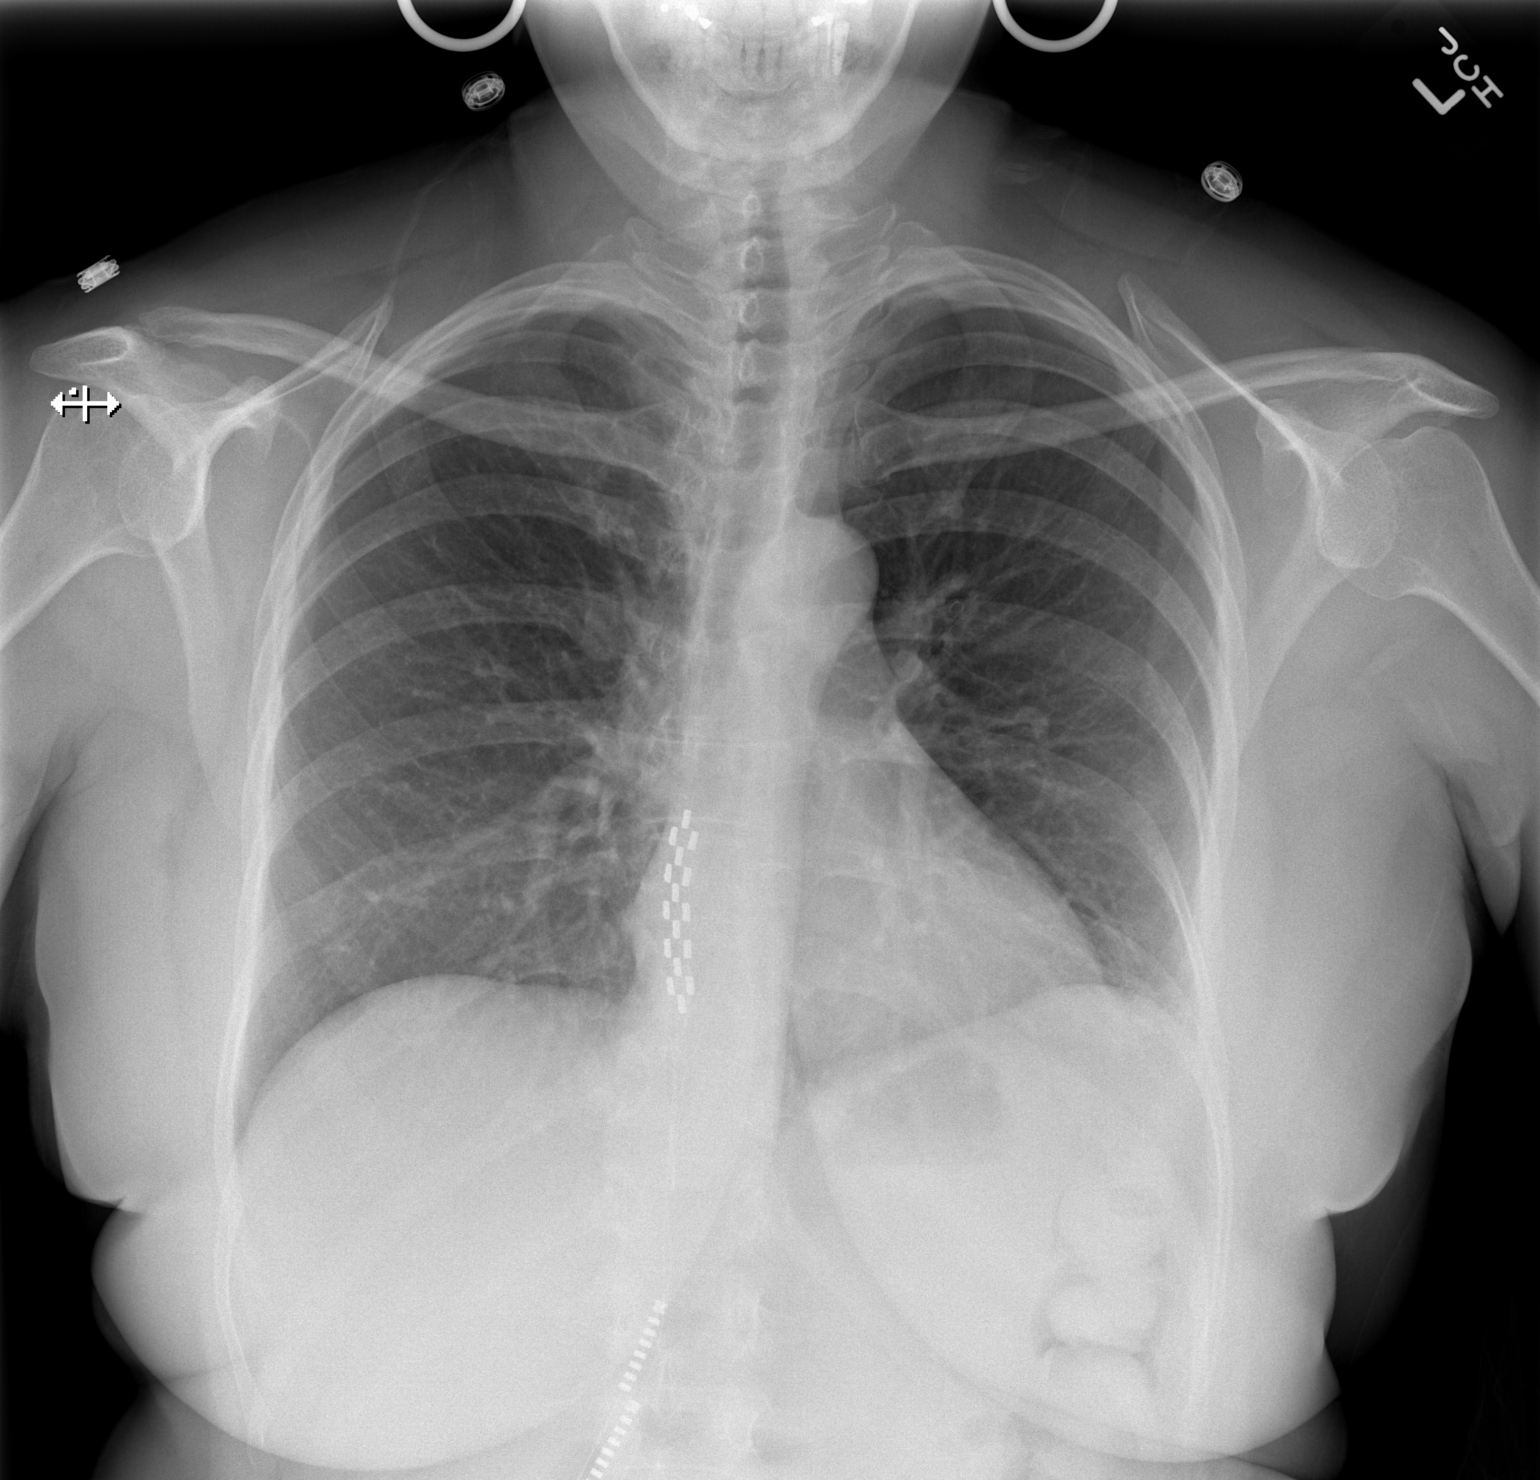

[w chest lat]
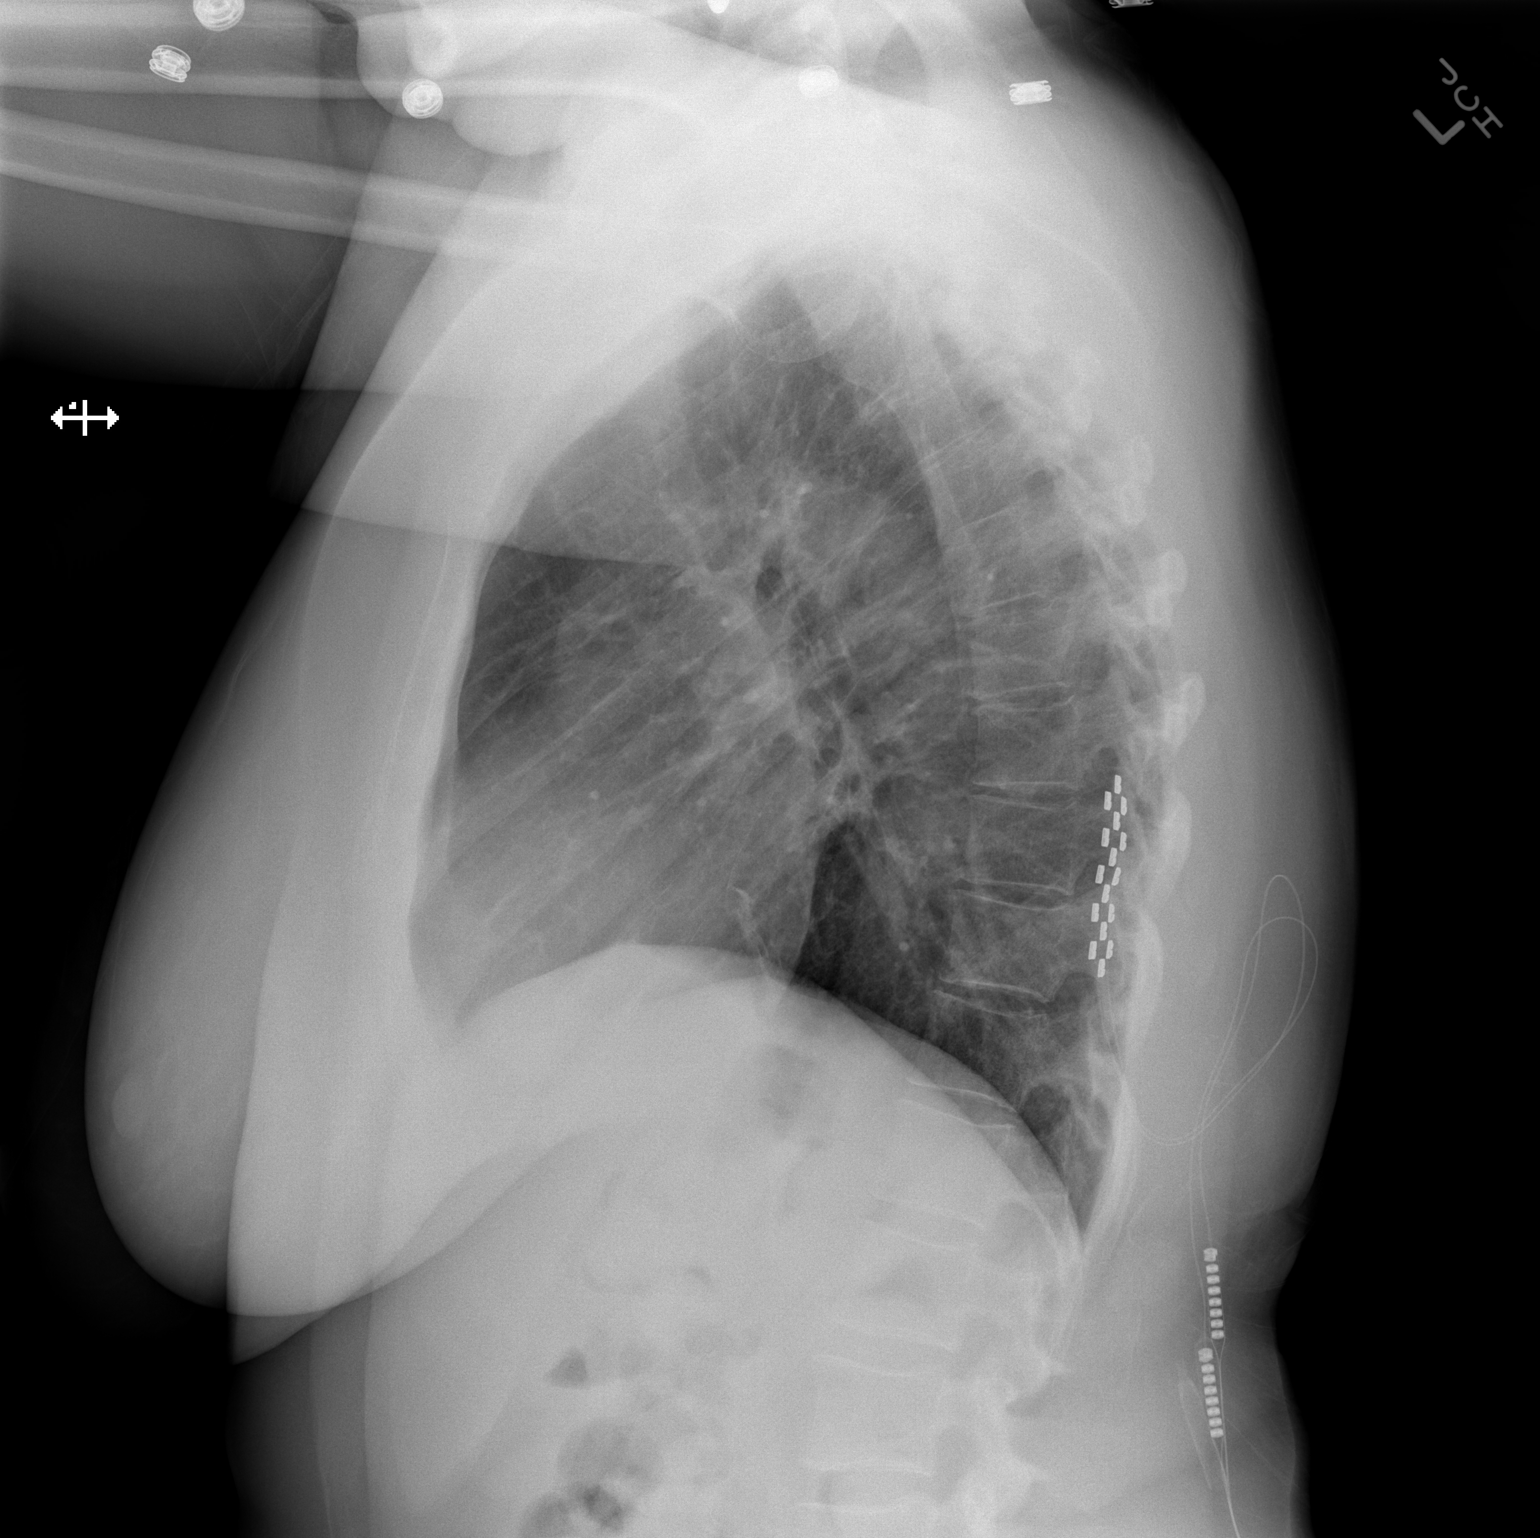

[2 of 2 positions shown; findings below may reference images not displayed]

FINDINGS: Cardiomediastinal silhouette is unremarkable. The lungs are clear
without pleural effusions or focal consolidations. Trachea projects
midline and there is no pneumothorax. Soft tissue planes and
included osseous structures are non-suspicious. Spinal stimulator
lead projects in mid thoracic spine, within the posterior canal on
the lateral radiograph.
IMPRESSION: No acute cardiopulmonary process.

  By: Jong-Young Sung Gyu

## 2014-05-21 ENCOUNTER — Ambulatory Visit: Payer: BC Managed Care – PPO | Admitting: Nurse Practitioner

## 2014-05-23 ENCOUNTER — Encounter: Payer: Self-pay | Admitting: Nurse Practitioner

## 2014-05-23 ENCOUNTER — Ambulatory Visit (INDEPENDENT_AMBULATORY_CARE_PROVIDER_SITE_OTHER): Payer: BLUE CROSS/BLUE SHIELD | Admitting: Nurse Practitioner

## 2014-05-23 VITALS — BP 118/76 | HR 71 | Ht 65.0 in | Wt 188.4 lb

## 2014-05-23 DIAGNOSIS — Z5181 Encounter for therapeutic drug level monitoring: Secondary | ICD-10-CM

## 2014-05-23 DIAGNOSIS — G500 Trigeminal neuralgia: Secondary | ICD-10-CM

## 2014-05-23 NOTE — Patient Instructions (Signed)
Continue Tegretol 300 twice daily Will check carbamazepine level today Follow-up in 6 months

## 2014-05-23 NOTE — Progress Notes (Addendum)
[facility]  PATIENT: Betty Mejia DOB: 10-11-1960   REASON FOR VISIT: follow up for trigeminal neuralgia HISTORY FROM:patient    HISTORY OF PRESENT ILLNESS:Betty Mejia, 54 year old female returns for followup. She was last seen in the office 10/23/2013. At that time she was having a flare of her facial pain/trigeminal neuralgia. Previously her  trigeminal neuralgia  had been well-controlled on 200 mg twice daily. The left facial area including the eye was  more painfuat that time.  Her symptoms may be triggered by ice or cold food talking on the phone and chewing she has not had any flares over the winter. Her dose of Tegretol was increased to 300 mg twice daily at her last visit with good results.  She denies side effects to the Tegretol, no dizziness no balance issues. She also sees pain management for chronic back pain. She is scheduled for hip surgery on the right in April. She returns for reevaluation  HISTORY: Betty Mejia is a 54 y.o. female here as a referral from Dr. Julio Sicks for trigeminal neuralgia  Symptoms started around 15 years ago, is on the left side. Currently taking tegretol  twice a day for this condition. Has been on a stable dose of tegretol for years. Notes her symptoms can be triggered by the wind, chewing, hot or cold food. Can tell symptoms are coming on due to twitching of her left eye. If she feels it coming on will take an extra tegretol and that usually calms her symptoms down. Last flare up was this past winter when she was up in [place], symptoms were resolved by dissolving a tegretol. Tolerating the tegretol well, no dizziness, mild fatigue.  Has a spinal stimulator for chronic pain, has given good benefit. 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 Is currently working with a pain management doctor for further management.  Prior neurologist was Dr Zenia Resides in [place], number is [phone]. Recently moved to this area.    REVIEW OF SYSTEMS: Full 14 system review of systems performed and notable only for those listed, all others are neg:  Constitutional: neg  Cardiovascular: neg Ear/Nose/Throat: neg  Skin: neg Eyes: neg Respiratory: neg Gastroitestinal: neg  Hematology/Lymphatic: neg  Endocrine: neg Musculoskeletal:Joint pain, right hip pain, walking difficulty Allergy/Immunology: neg Neurological: facial pain Psychiatric: neg Sleep Restless leg   ALLERGIES: No Known Allergies  HOME MEDICATIONS: Outpatient Prescriptions Prior to Visit  Medication Sig Dispense Refill  . carbamazepine (TEGRETOL) 200 MG tablet Take 1.5 tablets (300 mg total) by mouth 2 (two) times daily. 270 tablet 1  . levothyroxine (SYNTHROID, LEVOTHROID) 75 MCG tablet Take 75 mcg by mouth daily before breakfast.    . Multiple Vitamin (MULTIVITAMIN WITH MINERALS) TABS Take 1 tablet by mouth every morning.     . nabumetone (RELAFEN) 750 MG tablet Take 750 mg by mouth 2 (two) times daily.    . DULoxetine (CYMBALTA) 30 MG capsule Take 30 mg by mouth 2 (two) times daily.    Marland Kitchen gabapentin (NEURONTIN) 600 MG tablet Take 1,200 mg by mouth 3 (three) times daily.     Marland Kitchen oxyCODONE-acetaminophen (PERCOCET/ROXICET) 5-325 MG per tablet Take 2 tablets by mouth every 4 (four) hours as needed for severe pain. (Patient not taking: Reported on 05/23/2014) 20 tablet 0   No facility-administered medications prior to visit.    PAST MEDICAL HISTORY: Past Medical History  Diagnosis Date  . Trigeminal neuralgia   . Thyroid disease   . Back pain   . Arthritis  PAST SURGICAL HISTORY: Past Surgical History  Procedure Laterality Date  . Back surgery      stimulator in back  . Vaginal hysterectomy      FAMILY HISTORY: Family History  Problem Relation Age of Onset  . Hypertension Mother   . Cancer Father   . Cancer Maternal Grandmother     uterus    SOCIAL HISTORY: History   Social History  . Marital Status: Married    Spouse  Name: Iantha FallenKenneth  . Number of Children: 3  . Years of Education: 12   Occupational History  .      disabled   Social History Main Topics  . Smoking status: Never Smoker   . Smokeless tobacco: Never Used  . Alcohol Use: No  . Drug Use: No  . Sexual Activity: Not on file   Other Topics Concern  . Not on file   Social History Narrative   Patient is married with 3 children   Patient is right handed   Patient has high school education   Patient denies daily caffeine     PHYSICAL EXAM  Filed Vitals:   05/23/14 1033  BP: 118/76  Pulse: 71  Height: 5\' 5"  (1.651 m)  Weight: 188 lb 6.4 oz (85.458 kg)   Body mass index is 31.35 kg/(m^2). Generalized: Well developed, in no acute distress  Head: normocephalic and atraumatic,. Oropharynx benign  Neck: Supple, no carotid bruits  Cardiac: Regular rate rhythm, no murmur  Musculoskeletal: No deformity   Neurological examination   Mentation: Alert oriented to time, place, history taking. Follows all commands speech and language fluent  Cranial nerve II-XII: Pupils were equal round reactive to light extraocular movements were full, visual field were full on confrontational test. Facial sensation and strength were normal. hearing was intact to finger rubbing bilaterally. Uvula tongue midline. head turning and shoulder shrug were normal and symmetric.Tongue protrusion into cheek strength was normal. Motor: normal bulk and tone, full strength in the BUE, BLE, except right lower extremity 4 out of 5 . Fine finger movements normal, no pronator drift. No focal weakness Sensory: normal and symmetric to light touch, pinprick, and vibration  Coordination: finger-nose-finger, heel-to-shin bilaterally, no dysmetria Reflexes: Brachioradialis 2/2, biceps 2/2, triceps 2/2, patellar 2/2, Achilles 2/2, plantar responses were flexor bilaterally. Gait and Station: Rising up from seated position without assistance, normal stance, moderate stride,  good arm swing, smooth turning, wall walks with a limp on the right, uses single-point cane    DIAGNOSTIC DATA (LABS, IMAGING, TESTING) - I reviewed patient records, labs, notes, testing and imaging myself where available.  Lab Results  Component Value Date   WBC 7.2 06/09/2013   HGB 12.6 06/09/2013   HCT 38.2 06/09/2013   MCV 97.7 06/09/2013   PLT 264 06/09/2013      Component Value Date/Time   NA 142 06/09/2013 0027   K 4.7 06/09/2013 0027   CL 103 06/09/2013 0027   CO2 27 06/09/2013 0027   GLUCOSE 88 06/09/2013 0027   BUN 16 06/09/2013 0027   CREATININE 0.90 06/09/2013 0027   CALCIUM 9.3 06/09/2013 0027   GFRNONAA 72* 06/09/2013 0027   GFRAA 84* 06/09/2013 0027       ASSESSMENT AND PLAN  54 y.o. year old female  has a past medical history of Trigeminal neuralgia; Thyroid disease; Back pain; and Arthritis. here to follow-up for her facial pain. Her carbamazepine was increased to 300 mg twice a day at her last visit with good results.  She denies any side effects to the medication.Patient will be assigned to Dr. Lucia Gaskins.  Continue Tegretol 300 twice daily, will refill once labs back Will check carbamazepine level today and make adjustments if necessary Follow-up in 6 months Nilda Riggs, [facility], [facility], APRN  [facility] [address], [address] [place], [place] [address] [phone]  Personally examined patient and images, and have documentes history, physical, neuro exam,assessment and plan as stated above.   Naomie Dean, MD [facility]

## 2014-05-24 ENCOUNTER — Other Ambulatory Visit: Payer: Self-pay | Admitting: Nurse Practitioner

## 2014-05-24 ENCOUNTER — Telehealth: Payer: Self-pay

## 2014-05-24 LAB — CARBAMAZEPINE LEVEL, TOTAL: CARBAMAZEPINE LVL: 3.6 ug/mL — AB (ref 4.0–12.0)

## 2014-05-24 MED ORDER — CARBAMAZEPINE 200 MG PO TABS: 300.0000 mg | ORAL_TABLET | Freq: Two times a day (BID) | ORAL | Status: DC

## 2014-05-24 NOTE — Telephone Encounter (Signed)
Spoke to patient. Gave lab results. Patient verbalized understanding.  

## 2014-05-24 NOTE — Telephone Encounter (Signed)
-----   Message from Nilda RiggsNancy Carolyn Martin, NP sent at 05/24/2014  9:16 AM EST ----- Tegretol level is good. Please call the patient. I renewed meds

## 2014-05-30 ENCOUNTER — Other Ambulatory Visit: Payer: Self-pay | Admitting: Orthopedic Surgery

## 2014-07-11 ENCOUNTER — Encounter (HOSPITAL_COMMUNITY)
Admission: RE | Admit: 2014-07-11 | Discharge: 2014-07-11 | Disposition: A | Discharge status: Home / Self Care | Payer: BLUE CROSS/BLUE SHIELD | Source: Ambulatory Visit | Attending: Orthopedic Surgery | Admitting: Orthopedic Surgery

## 2014-07-11 ENCOUNTER — Encounter (HOSPITAL_COMMUNITY): Payer: Self-pay

## 2014-07-11 DIAGNOSIS — Z0183 Encounter for blood typing: Secondary | ICD-10-CM | POA: Diagnosis not present

## 2014-07-11 DIAGNOSIS — Z01818 Encounter for other preprocedural examination: Secondary | ICD-10-CM | POA: Insufficient documentation

## 2014-07-11 DIAGNOSIS — Z01812 Encounter for preprocedural laboratory examination: Secondary | ICD-10-CM | POA: Insufficient documentation

## 2014-07-11 DIAGNOSIS — M1611 Unilateral primary osteoarthritis, right hip: Secondary | ICD-10-CM | POA: Diagnosis not present

## 2014-07-11 HISTORY — DX: Anxiety disorder, unspecified: F41.9

## 2014-07-11 HISTORY — DX: Hypothyroidism, unspecified: E03.9

## 2014-07-11 LAB — TYPE AND SCREEN
ABO/RH(D): O POS
Antibody Screen: NEGATIVE

## 2014-07-11 LAB — BASIC METABOLIC PANEL
Anion gap: 12 (ref 5–15)
BUN: 15 mg/dL (ref 6–23)
CO2: 23 mmol/L (ref 19–32)
Calcium: 9.1 mg/dL (ref 8.4–10.5)
Chloride: 106 mmol/L (ref 96–112)
Creatinine, Ser: 0.93 mg/dL (ref 0.50–1.10)
GFR calc Af Amer: 80 mL/min — ABNORMAL LOW (ref >90)
GFR calc non Af Amer: 69 mL/min — ABNORMAL LOW (ref >90)
GLUCOSE: 76 mg/dL (ref 70–99)
POTASSIUM: 4.1 mmol/L (ref 3.5–5.1)
Sodium: 141 mmol/L (ref 135–145)

## 2014-07-11 LAB — CBC WITH DIFFERENTIAL/PLATELET
Basophils Absolute: 0 10*3/uL (ref 0.0–0.1)
Basophils Relative: 1 % (ref 0–1)
Eosinophils Absolute: 0.1 10*3/uL (ref 0.0–0.7)
Eosinophils Relative: 2 % (ref 0–5)
HEMATOCRIT: 43.2 % (ref 36.0–46.0)
HEMOGLOBIN: 14 g/dL (ref 12.0–15.0)
LYMPHS PCT: 47 % — AB (ref 12–46)
Lymphs Abs: 2.9 10*3/uL (ref 0.7–4.0)
MCH: 32.3 pg (ref 26.0–34.0)
MCHC: 32.4 g/dL (ref 30.0–36.0)
MCV: 99.5 fL (ref 78.0–100.0)
MONO ABS: 0.3 10*3/uL (ref 0.1–1.0)
Monocytes Relative: 5 % (ref 3–12)
NEUTROS ABS: 2.7 10*3/uL (ref 1.7–7.7)
Neutrophils Relative %: 45 % (ref 43–77)
Platelets: 318 10*3/uL (ref 150–400)
RBC: 4.34 MIL/uL (ref 3.87–5.11)
RDW: 13.4 % (ref 11.5–15.5)
WBC: 6.1 10*3/uL (ref 4.0–10.5)

## 2014-07-11 LAB — APTT: aPTT: 32 seconds (ref 24–37)

## 2014-07-11 LAB — URINALYSIS, ROUTINE W REFLEX MICROSCOPIC
BILIRUBIN URINE: NEGATIVE
GLUCOSE, UA: NEGATIVE mg/dL
HGB URINE DIPSTICK: NEGATIVE
KETONES UR: NEGATIVE mg/dL
Leukocytes, UA: NEGATIVE
Nitrite: NEGATIVE
PH: 6 (ref 5.0–8.0)
PROTEIN: NEGATIVE mg/dL
Specific Gravity, Urine: 1.019 (ref 1.005–1.030)
Urobilinogen, UA: 1 mg/dL (ref 0.0–1.0)

## 2014-07-11 LAB — ABO/RH: ABO/RH(D): O POS

## 2014-07-11 LAB — PROTIME-INR
INR: 1.09 (ref 0.00–1.49)
Prothrombin Time: 14.2 seconds (ref 11.6–15.2)

## 2014-07-11 LAB — SURGICAL PCR SCREEN
MRSA, PCR: NEGATIVE
STAPHYLOCOCCUS AUREUS: NEGATIVE

## 2014-07-11 NOTE — Pre-Procedure Instructions (Signed)
Betty NimsBarbara Mejia  07/11/2014   Your procedure is scheduled on:  07/22/14  [facility] at 530 AM.  Call this number if you have problems the morning of surgery: [phone]   Remember:   Do not eat food or drink liquids after midnight.   Take these medicines the morning of surgery with A SIP OF WATER: tegretol,synthroid,pain med.,lyrica   Do not wear jewelry, make-up or nail polish.  Do not wear lotions, powders, or perfumes. You may wear deodorant.  Do not shave 48 hours prior to surgery. Men may shave face and neck.  Do not bring valuables to the hospital.  [facility] is not responsible                  for any belongings or valuables.               Contacts, dentures or bridgework may not be worn into surgery.  Leave suitcase in the car. After surgery it may be brought to your room.  For patients admitted to the hospital, discharge time is determined by your                treatment team.               Patients discharged the day of surgery will not be allowed to drive  home.  Name and phone number of your driver:   Special Instructions: Shower using CHG 2 nights before surgery and the night before surgery.  If you shower the day of surgery use CHG.  Use special wash - you have one bottle of CHG for all showers.  You should use approximately 1/3 of the bottle for each shower.   Please read over the following fact sheets that you were given: Pain Booklet, Coughing and Deep Breathing, Blood Transfusion Information, MRSA Information and Surgical Site Infection Prevention

## 2014-07-19 DIAGNOSIS — M1611 Unilateral primary osteoarthritis, right hip: Secondary | ICD-10-CM | POA: Diagnosis present

## 2014-07-19 MED ORDER — TRANEXAMIC ACID 1000 MG/10ML IV SOLN
1000.0000 mg | INTRAVENOUS | Status: AC
  Administered 2014-07-22: 1000 mg via INTRAVENOUS
  Filled 2014-07-19: qty 10

## 2014-07-19 NOTE — H&P (Signed)
TOTAL HIP ADMISSION H&P  Patient is admitted for right total hip arthroplasty.  Subjective:  Chief Complaint: right hip pain  HPI: Betty Mejia, 54 y.o. female, has a history of pain and functional disability in the right hip(s) due to arthritis and patient has failed non-surgical conservative treatments for greater than 12 weeks to include NSAID's and/or analgesics, flexibility and strengthening excercises, weight reduction as appropriate and activity modification.  Onset of symptoms was gradual starting >10 years ago with gradually worsening course since that time.The patient noted no past surgery on the right hip(s).  Patient currently rates pain in the right hip at 10 out of 10 with activity. Patient has night pain, worsening of pain with activity and weight bearing, trendelenberg gait, pain that interfers with activities of daily living and pain with passive range of motion. Patient has evidence of periarticular osteophytes, subchondral cysts and joint space narrowing by imaging studies. This condition presents safety issues increasing the risk of falls.   There is no current active infection.  Patient Active Problem List   Diagnosis Date Noted  . Trigeminal neuralgia 07/06/2013   Past Medical History  Diagnosis Date  . Trigeminal neuralgia   . Thyroid disease   . Back pain   . Arthritis   . Hypothyroidism   . Anxiety     Past Surgical History  Procedure Laterality Date  . Vaginal hysterectomy    . Back surgery      stimulator in back  x4    No prescriptions prior to admission   No Known Allergies  History  Substance Use Topics  . Smoking status: Never Smoker   . Smokeless tobacco: Never Used  . Alcohol Use: No    Family History  Problem Relation Age of Onset  . Hypertension Mother   . Cancer Father   . Cancer Maternal Grandmother     uterus     Review of Systems  Constitutional: Negative.   HENT: Negative.   Eyes: Negative.   Respiratory: Negative.    Cardiovascular: Positive for leg swelling.  Gastrointestinal: Negative.   Genitourinary: Negative.   Musculoskeletal: Positive for joint pain.  Skin: Negative.   Neurological: Positive for focal weakness.  Endo/Heme/Allergies:       Over active thyroid  Psychiatric/Behavioral: Negative.     Objective:  Physical Exam  Constitutional: She is oriented to person, place, and time. She appears well-developed and well-nourished.  HENT:  Head: Normocephalic and atraumatic.  Eyes: Pupils are equal, round, and reactive to light.  Neck: Normal range of motion. Neck supple.  Cardiovascular: Intact distal pulses.   Respiratory: Effort normal.  Musculoskeletal: She exhibits tenderness.  She continues have irritation of the right hip and groin with internal rotation.  Foot tap is negative.  She does remain tender over the greater trochanter as well, but that is not the major source of her pain.  Neurological: She is alert and oriented to person, place, and time.  Skin: Skin is warm and dry.  Psychiatric: She has a normal mood and affect. Her behavior is normal. Judgment and thought content normal.    Vital signs in last 24 hours:    Labs:   Estimated body mass index is 31.35 kg/(m^2) as calculated from the following:   Height as of 05/23/14: 5\' 5"  (1.651 m).   Weight as of 05/23/14: 85.458 kg (188 lb 6.4 oz).   Imaging Review Plain radiographs demonstrate AP pelvis and crosstable lateral of the right hip are normal for age.  There is a very small 1 mm peripheral lateral osteophyte on the femoral head bilaterally.  A CT arthrogram has been accomplished, that shows medial pole bone-on-bone arthritis with an impressive drop osteophyte.    Assessment/Plan:  End stage arthritis, right hip(s)  The patient history, physical examination, clinical judgement of the provider and imaging studies are consistent with end stage degenerative joint disease of the right hip(s) and total hip arthroplasty  is deemed medically necessary. The treatment options including medical management, injection therapy, arthroscopy and arthroplasty were discussed at length. The risks and benefits of total hip arthroplasty were presented and reviewed. The risks due to aseptic loosening, infection, stiffness, dislocation/subluxation,  thromboembolic complications and other imponderables were discussed.  The patient acknowledged the explanation, agreed to proceed with the plan and consent was signed. Patient is being admitted for inpatient treatment for surgery, pain control, PT, OT, prophylactic antibiotics, VTE prophylaxis, progressive ambulation and ADL's and discharge planning.The patient is planning to be discharged home with home health services

## 2014-07-21 MED ORDER — DEXTROSE-NACL 5-0.45 % IV SOLN: INTRAVENOUS | Status: DC

## 2014-07-21 MED ORDER — CHLORHEXIDINE GLUCONATE 4 % EX LIQD
60.0000 mL | Freq: Once | CUTANEOUS | Status: DC
  Filled 2014-07-21: qty 60

## 2014-07-21 MED ORDER — CEFAZOLIN SODIUM-DEXTROSE 2-3 GM-% IV SOLR
2.0000 g | INTRAVENOUS | Status: AC
  Administered 2014-07-22: 2 g via INTRAVENOUS
  Filled 2014-07-21: qty 50

## 2014-07-22 ENCOUNTER — Inpatient Hospital Stay (HOSPITAL_COMMUNITY): Payer: BLUE CROSS/BLUE SHIELD | Admitting: Anesthesiology

## 2014-07-22 ENCOUNTER — Inpatient Hospital Stay (HOSPITAL_COMMUNITY): Payer: BLUE CROSS/BLUE SHIELD

## 2014-07-22 ENCOUNTER — Encounter (HOSPITAL_COMMUNITY)
Admission: RE | Disposition: A | Discharge status: Home / Self Care | Payer: Self-pay | Source: Ambulatory Visit | Attending: Orthopedic Surgery

## 2014-07-22 ENCOUNTER — Inpatient Hospital Stay (HOSPITAL_COMMUNITY)
Admission: RE | Admit: 2014-07-22 | Discharge: 2014-07-24 | DRG: 470 | Disposition: A | Discharge status: Home / Self Care | Payer: BLUE CROSS/BLUE SHIELD | Source: Ambulatory Visit | Attending: Orthopedic Surgery | Admitting: Orthopedic Surgery

## 2014-07-22 ENCOUNTER — Encounter (HOSPITAL_COMMUNITY): Payer: Self-pay | Admitting: Surgery

## 2014-07-22 DIAGNOSIS — M1611 Unilateral primary osteoarthritis, right hip: Secondary | ICD-10-CM | POA: Diagnosis present

## 2014-07-22 DIAGNOSIS — D62 Acute posthemorrhagic anemia: Secondary | ICD-10-CM | POA: Diagnosis not present

## 2014-07-22 DIAGNOSIS — M25551 Pain in right hip: Secondary | ICD-10-CM | POA: Diagnosis present

## 2014-07-22 DIAGNOSIS — Z96649 Presence of unspecified artificial hip joint: Secondary | ICD-10-CM

## 2014-07-22 DIAGNOSIS — E039 Hypothyroidism, unspecified: Secondary | ICD-10-CM | POA: Diagnosis present

## 2014-07-22 DIAGNOSIS — Z90710 Acquired absence of both cervix and uterus: Secondary | ICD-10-CM | POA: Diagnosis not present

## 2014-07-22 DIAGNOSIS — G500 Trigeminal neuralgia: Secondary | ICD-10-CM | POA: Diagnosis present

## 2014-07-22 DIAGNOSIS — F419 Anxiety disorder, unspecified: Secondary | ICD-10-CM | POA: Diagnosis present

## 2014-07-22 HISTORY — PX: TOTAL HIP ARTHROPLASTY: SHX124

## 2014-07-22 SURGERY — ARTHROPLASTY, HIP, TOTAL,POSTERIOR APPROACH
Anesthesia: General | Site: Hip | Laterality: Right

## 2014-07-22 MED ORDER — DOCUSATE SODIUM 100 MG PO CAPS
100.0000 mg | ORAL_CAPSULE | Freq: Two times a day (BID) | ORAL | Status: DC
  Administered 2014-07-22 – 2014-07-24 (×4): 100 mg via ORAL
  Filled 2014-07-22 (×4): qty 1

## 2014-07-22 MED ORDER — KCL IN DEXTROSE-NACL 20-5-0.45 MEQ/L-%-% IV SOLN
INTRAVENOUS | Status: DC
  Administered 2014-07-22: 20:00:00 via INTRAVENOUS
  Administered 2014-07-22: 125 mL/h via INTRAVENOUS
  Administered 2014-07-23 (×3): via INTRAVENOUS
  Filled 2014-07-22 (×8): qty 1000

## 2014-07-22 MED ORDER — OXYCODONE-ACETAMINOPHEN 5-325 MG PO TABS: 1.0000 | ORAL_TABLET | ORAL | Status: DC | PRN

## 2014-07-22 MED ORDER — OXYCODONE HCL 5 MG PO TABS
ORAL_TABLET | ORAL | Status: AC
  Filled 2014-07-22: qty 2

## 2014-07-22 MED ORDER — PROPOFOL 10 MG/ML IV BOLUS
INTRAVENOUS | Status: DC | PRN
  Administered 2014-07-22: 180 mg via INTRAVENOUS

## 2014-07-22 MED ORDER — HYDROMORPHONE HCL 1 MG/ML IJ SOLN
0.5000 mg | INTRAMUSCULAR | Status: DC | PRN
  Administered 2014-07-22 – 2014-07-23 (×7): 1 mg via INTRAVENOUS
  Filled 2014-07-22 (×9): qty 1

## 2014-07-22 MED ORDER — ROCURONIUM BROMIDE 50 MG/5ML IV SOLN
INTRAVENOUS | Status: AC
  Filled 2014-07-22: qty 1

## 2014-07-22 MED ORDER — ACETAMINOPHEN 325 MG PO TABS: 650.0000 mg | ORAL_TABLET | Freq: Four times a day (QID) | ORAL | Status: DC | PRN

## 2014-07-22 MED ORDER — HYDROMORPHONE HCL 1 MG/ML IJ SOLN
INTRAMUSCULAR | Status: AC
  Administered 2014-07-22: 15:00:00
  Filled 2014-07-22: qty 1

## 2014-07-22 MED ORDER — KCL IN DEXTROSE-NACL 20-5-0.45 MEQ/L-%-% IV SOLN
INTRAVENOUS | Status: AC
  Filled 2014-07-22: qty 1000

## 2014-07-22 MED ORDER — PREGABALIN 75 MG PO CAPS
75.0000 mg | ORAL_CAPSULE | Freq: Two times a day (BID) | ORAL | Status: DC
  Administered 2014-07-23 – 2014-07-24 (×3): 75 mg via ORAL
  Filled 2014-07-22 (×4): qty 1

## 2014-07-22 MED ORDER — ONDANSETRON HCL 4 MG/2ML IJ SOLN: 4.0000 mg | Freq: Four times a day (QID) | INTRAMUSCULAR | Status: DC | PRN

## 2014-07-22 MED ORDER — METHOCARBAMOL 500 MG PO TABS: 500.0000 mg | ORAL_TABLET | Freq: Two times a day (BID) | ORAL | Status: DC

## 2014-07-22 MED ORDER — HYDROMORPHONE HCL 1 MG/ML IJ SOLN
INTRAMUSCULAR | Status: AC
  Filled 2014-07-22: qty 1

## 2014-07-22 MED ORDER — FENTANYL CITRATE (PF) 250 MCG/5ML IJ SOLN
INTRAMUSCULAR | Status: AC
  Filled 2014-07-22: qty 5

## 2014-07-22 MED ORDER — NABUMETONE 750 MG PO TABS
750.0000 mg | ORAL_TABLET | Freq: Two times a day (BID) | ORAL | Status: DC
  Administered 2014-07-22 – 2014-07-24 (×4): 750 mg via ORAL
  Filled 2014-07-22 (×5): qty 1

## 2014-07-22 MED ORDER — ACETAMINOPHEN 650 MG RE SUPP: 650.0000 mg | Freq: Four times a day (QID) | RECTAL | Status: DC | PRN

## 2014-07-22 MED ORDER — FENTANYL CITRATE (PF) 100 MCG/2ML IJ SOLN
INTRAMUSCULAR | Status: DC | PRN
  Administered 2014-07-22: 100 ug via INTRAVENOUS
  Administered 2014-07-22: 50 ug via INTRAVENOUS
  Administered 2014-07-22: 75 ug via INTRAVENOUS
  Administered 2014-07-22: 25 ug via INTRAVENOUS

## 2014-07-22 MED ORDER — KETOROLAC TROMETHAMINE 30 MG/ML IJ SOLN
30.0000 mg | Freq: Once | INTRAMUSCULAR | Status: AC | PRN
  Administered 2014-07-22: 30 mg via INTRAVENOUS

## 2014-07-22 MED ORDER — 0.9 % SODIUM CHLORIDE (POUR BTL) OPTIME
TOPICAL | Status: DC | PRN
  Administered 2014-07-22: 1000 mL

## 2014-07-22 MED ORDER — CARBAMAZEPINE 200 MG PO TABS
200.0000 mg | ORAL_TABLET | Freq: Two times a day (BID) | ORAL | Status: DC
  Administered 2014-07-22 – 2014-07-24 (×4): 200 mg via ORAL
  Filled 2014-07-22 (×4): qty 1

## 2014-07-22 MED ORDER — HYDROMORPHONE HCL 1 MG/ML IJ SOLN
0.2500 mg | INTRAMUSCULAR | Status: DC | PRN
  Administered 2014-07-22 (×4): 0.5 mg via INTRAVENOUS

## 2014-07-22 MED ORDER — ASPIRIN EC 325 MG PO TBEC
325.0000 mg | DELAYED_RELEASE_TABLET | Freq: Every day | ORAL | Status: DC
  Administered 2014-07-23 – 2014-07-24 (×2): 325 mg via ORAL
  Filled 2014-07-22 (×2): qty 1

## 2014-07-22 MED ORDER — MENTHOL 3 MG MT LOZG: 1.0000 | LOZENGE | OROMUCOSAL | Status: DC | PRN

## 2014-07-22 MED ORDER — LEVOTHYROXINE SODIUM 75 MCG PO TABS
75.0000 ug | ORAL_TABLET | Freq: Every day | ORAL | Status: DC
  Administered 2014-07-23 – 2014-07-24 (×2): 75 ug via ORAL
  Filled 2014-07-22 (×3): qty 1

## 2014-07-22 MED ORDER — LACTATED RINGERS IV SOLN
INTRAVENOUS | Status: DC | PRN
  Administered 2014-07-22 (×2): via INTRAVENOUS

## 2014-07-22 MED ORDER — PHENOL 1.4 % MT LIQD: 1.0000 | OROMUCOSAL | Status: DC | PRN

## 2014-07-22 MED ORDER — METHOCARBAMOL 500 MG PO TABS
500.0000 mg | ORAL_TABLET | Freq: Four times a day (QID) | ORAL | Status: DC | PRN
  Administered 2014-07-22 – 2014-07-24 (×6): 500 mg via ORAL
  Filled 2014-07-22 (×6): qty 1

## 2014-07-22 MED ORDER — MIDAZOLAM HCL 2 MG/2ML IJ SOLN
INTRAMUSCULAR | Status: AC
  Filled 2014-07-22: qty 2

## 2014-07-22 MED ORDER — PROPOFOL 10 MG/ML IV BOLUS
INTRAVENOUS | Status: AC
  Filled 2014-07-22: qty 20

## 2014-07-22 MED ORDER — METOCLOPRAMIDE HCL 5 MG/ML IJ SOLN
5.0000 mg | Freq: Three times a day (TID) | INTRAMUSCULAR | Status: DC | PRN
Start: 2014-07-22 — End: 2014-07-24

## 2014-07-22 MED ORDER — PROMETHAZINE HCL 25 MG/ML IJ SOLN: 6.2500 mg | INTRAMUSCULAR | Status: DC | PRN

## 2014-07-22 MED ORDER — DIPHENHYDRAMINE HCL 12.5 MG/5ML PO ELIX
12.5000 mg | ORAL_SOLUTION | ORAL | Status: DC | PRN
  Administered 2014-07-23: 25 mg via ORAL
  Filled 2014-07-22: qty 10

## 2014-07-22 MED ORDER — SENNOSIDES-DOCUSATE SODIUM 8.6-50 MG PO TABS: 1.0000 | ORAL_TABLET | Freq: Every evening | ORAL | Status: DC | PRN

## 2014-07-22 MED ORDER — MIDAZOLAM HCL 5 MG/5ML IJ SOLN
INTRAMUSCULAR | Status: DC | PRN
  Administered 2014-07-22: 2 mg via INTRAVENOUS

## 2014-07-22 MED ORDER — LIDOCAINE HCL (CARDIAC) 20 MG/ML IV SOLN
INTRAVENOUS | Status: AC
  Filled 2014-07-22: qty 5

## 2014-07-22 MED ORDER — ROCURONIUM BROMIDE 100 MG/10ML IV SOLN
INTRAVENOUS | Status: DC | PRN
  Administered 2014-07-22: 40 mg via INTRAVENOUS

## 2014-07-22 MED ORDER — METHOCARBAMOL 1000 MG/10ML IJ SOLN: 500.0000 mg | Freq: Four times a day (QID) | INTRAVENOUS | Status: DC | PRN

## 2014-07-22 MED ORDER — ONDANSETRON HCL 4 MG PO TABS: 4.0000 mg | ORAL_TABLET | Freq: Four times a day (QID) | ORAL | Status: DC | PRN

## 2014-07-22 MED ORDER — METOCLOPRAMIDE HCL 5 MG PO TABS: 5.0000 mg | ORAL_TABLET | Freq: Three times a day (TID) | ORAL | Status: DC | PRN

## 2014-07-22 MED ORDER — KETOROLAC TROMETHAMINE 30 MG/ML IJ SOLN
INTRAMUSCULAR | Status: AC
  Filled 2014-07-22: qty 1

## 2014-07-22 MED ORDER — METHOCARBAMOL 1000 MG/10ML IJ SOLN
500.0000 mg | INTRAVENOUS | Status: AC
  Administered 2014-07-22: 500 mg via INTRAVENOUS
  Filled 2014-07-22: qty 5

## 2014-07-22 MED ORDER — OXYCODONE HCL 5 MG PO TABS
5.0000 mg | ORAL_TABLET | ORAL | Status: DC | PRN
  Administered 2014-07-22: 10 mg via ORAL
  Administered 2014-07-22: 5 mg via ORAL
  Administered 2014-07-22 – 2014-07-24 (×8): 10 mg via ORAL
  Filled 2014-07-22 (×9): qty 2

## 2014-07-22 MED ORDER — BUPIVACAINE-EPINEPHRINE (PF) 0.5% -1:200000 IJ SOLN
INTRAMUSCULAR | Status: AC
Start: 2014-07-22 — End: 2014-07-22
  Filled 2014-07-22: qty 30

## 2014-07-22 MED ORDER — PHENYLEPHRINE 40 MCG/ML (10ML) SYRINGE FOR IV PUSH (FOR BLOOD PRESSURE SUPPORT)
PREFILLED_SYRINGE | INTRAVENOUS | Status: AC
  Filled 2014-07-22: qty 10

## 2014-07-22 MED ORDER — PHENYLEPHRINE HCL 10 MG/ML IJ SOLN
INTRAMUSCULAR | Status: DC | PRN
  Administered 2014-07-22 (×5): 80 ug via INTRAVENOUS
  Administered 2014-07-22: 120 ug via INTRAVENOUS
  Administered 2014-07-22 (×2): 80 ug via INTRAVENOUS
  Administered 2014-07-22: 40 ug via INTRAVENOUS

## 2014-07-22 MED ORDER — ALUM & MAG HYDROXIDE-SIMETH 200-200-20 MG/5ML PO SUSP: 30.0000 mL | ORAL | Status: DC | PRN

## 2014-07-22 MED ORDER — DEXAMETHASONE SODIUM PHOSPHATE 10 MG/ML IJ SOLN
10.0000 mg | Freq: Once | INTRAMUSCULAR | Status: AC
  Administered 2014-07-23: 10 mg via INTRAVENOUS
  Filled 2014-07-22: qty 1

## 2014-07-22 MED ORDER — FLEET ENEMA 7-19 GM/118ML RE ENEM: 1.0000 | ENEMA | Freq: Once | RECTAL | Status: AC | PRN

## 2014-07-22 MED ORDER — BISACODYL 5 MG PO TBEC: 5.0000 mg | DELAYED_RELEASE_TABLET | Freq: Every day | ORAL | Status: DC | PRN

## 2014-07-22 MED ORDER — BUPIVACAINE-EPINEPHRINE 0.5% -1:200000 IJ SOLN
INTRAMUSCULAR | Status: DC | PRN
  Administered 2014-07-22: 19 mL

## 2014-07-22 MED ORDER — LIDOCAINE HCL (CARDIAC) 20 MG/ML IV SOLN
INTRAVENOUS | Status: DC | PRN
  Administered 2014-07-22: 80 mg via INTRAVENOUS

## 2014-07-22 MED ORDER — ASPIRIN EC 325 MG PO TBEC: 325.0000 mg | DELAYED_RELEASE_TABLET | Freq: Two times a day (BID) | ORAL | Status: DC

## 2014-07-22 MED ORDER — ONDANSETRON HCL 4 MG/2ML IJ SOLN
INTRAMUSCULAR | Status: DC | PRN
  Administered 2014-07-22 (×2): 4 mg via INTRAVENOUS

## 2014-07-22 SURGICAL SUPPLY — 56 items
BLADE SAW SGTL 18X1.27X75 (BLADE) ×2 IMPLANT
BLADE SAW SGTL 18X1.27X75MM (BLADE) ×1
BRUSH FEMORAL CANAL (MISCELLANEOUS) IMPLANT
CAPT HIP TOTAL 2 ×3 IMPLANT
CLOSURE STERI-STRIP 1/2X4 (GAUZE/BANDAGES/DRESSINGS) ×1
CLSR STERI-STRIP ANTIMIC 1/2X4 (GAUZE/BANDAGES/DRESSINGS) ×2 IMPLANT
COVER BACK TABLE 24X17X13 BIG (DRAPES) IMPLANT
COVER SURGICAL LIGHT HANDLE (MISCELLANEOUS) ×3 IMPLANT
DRAPE IMP U-DRAPE 54X76 (DRAPES) ×3 IMPLANT
DRAPE ORTHO SPLIT 77X108 STRL (DRAPES) ×2
DRAPE PROXIMA HALF (DRAPES) ×3 IMPLANT
DRAPE SURG ORHT 6 SPLT 77X108 (DRAPES) ×1 IMPLANT
DRAPE U-SHAPE 47X51 STRL (DRAPES) ×3 IMPLANT
DRILL BIT 7/64X5 (BIT) ×3 IMPLANT
DRSG AQUACEL AG ADV 3.5X10 (GAUZE/BANDAGES/DRESSINGS) ×3 IMPLANT
DURAPREP 26ML APPLICATOR (WOUND CARE) ×3 IMPLANT
ELECT BLADE 4.0 EZ CLEAN MEGAD (MISCELLANEOUS)
ELECT REM PT RETURN 9FT ADLT (ELECTROSURGICAL) ×3
ELECTRODE BLDE 4.0 EZ CLN MEGD (MISCELLANEOUS) IMPLANT
ELECTRODE REM PT RTRN 9FT ADLT (ELECTROSURGICAL) ×1 IMPLANT
GLOVE BIO SURGEON STRL SZ7.5 (GLOVE) ×3 IMPLANT
GLOVE BIO SURGEON STRL SZ8.5 (GLOVE) ×6 IMPLANT
GLOVE BIOGEL PI IND STRL 8 (GLOVE) ×2 IMPLANT
GLOVE BIOGEL PI IND STRL 9 (GLOVE) ×1 IMPLANT
GLOVE BIOGEL PI INDICATOR 8 (GLOVE) ×4
GLOVE BIOGEL PI INDICATOR 9 (GLOVE) ×2
GOWN STRL REUS W/ TWL LRG LVL3 (GOWN DISPOSABLE) ×2 IMPLANT
GOWN STRL REUS W/ TWL XL LVL3 (GOWN DISPOSABLE) ×3 IMPLANT
GOWN STRL REUS W/TWL LRG LVL3 (GOWN DISPOSABLE) ×4
GOWN STRL REUS W/TWL XL LVL3 (GOWN DISPOSABLE) ×6
HANDPIECE INTERPULSE COAX TIP (DISPOSABLE)
HOOD PEEL AWAY FACE SHEILD DIS (HOOD) ×6 IMPLANT
KIT BASIN OR (CUSTOM PROCEDURE TRAY) ×3 IMPLANT
KIT ROOM TURNOVER OR (KITS) ×3 IMPLANT
MANIFOLD NEPTUNE II (INSTRUMENTS) ×3 IMPLANT
NEEDLE 22X1 1/2 (OR ONLY) (NEEDLE) ×3 IMPLANT
NS IRRIG 1000ML POUR BTL (IV SOLUTION) ×3 IMPLANT
PACK TOTAL JOINT (CUSTOM PROCEDURE TRAY) ×3 IMPLANT
PACK UNIVERSAL I (CUSTOM PROCEDURE TRAY) ×3 IMPLANT
PAD ARMBOARD 7.5X6 YLW CONV (MISCELLANEOUS) ×6 IMPLANT
PASSER SUT SWANSON 36MM LOOP (INSTRUMENTS) ×3 IMPLANT
PRESSURIZER FEMORAL UNIV (MISCELLANEOUS) IMPLANT
SET HNDPC FAN SPRY TIP SCT (DISPOSABLE) IMPLANT
SUT ETHIBOND 2 V 37 (SUTURE) ×3 IMPLANT
SUT VIC AB 0 CTB1 27 (SUTURE) ×3 IMPLANT
SUT VIC AB 1 CTX 36 (SUTURE) ×2
SUT VIC AB 1 CTX36XBRD ANBCTR (SUTURE) ×1 IMPLANT
SUT VIC AB 2-0 CTB1 (SUTURE) ×3 IMPLANT
SUT VIC AB 3-0 SH 27 (SUTURE) ×2
SUT VIC AB 3-0 SH 27X BRD (SUTURE) ×1 IMPLANT
SYR CONTROL 10ML LL (SYRINGE) ×3 IMPLANT
TOWEL OR 17X24 6PK STRL BLUE (TOWEL DISPOSABLE) ×3 IMPLANT
TOWEL OR 17X26 10 PK STRL BLUE (TOWEL DISPOSABLE) ×3 IMPLANT
TOWER CARTRIDGE SMART MIX (DISPOSABLE) IMPLANT
TRAY FOLEY CATH 14FR (SET/KITS/TRAYS/PACK) IMPLANT
WATER STERILE IRR 1000ML POUR (IV SOLUTION) IMPLANT

## 2014-07-22 NOTE — Op Note (Signed)
OPERATIVE REPORT    DATE OF PROCEDURE:  07/22/2014       PREOPERATIVE DIAGNOSIS:  RIGHT HIP OSTEOARTHRITIS                                                          POSTOPERATIVE DIAGNOSIS:  RIGHT HIP OSTEOARTHRITIS                                                           PROCEDURE:  R total hip arthroplasty using a 50 mm DePuy Pinnacle  Cup, [employer], 10-degree polyethylene liner index superior  and posterior, a +0 32 mm ceramic head, a 81x13x42x150 SROM stem, 18Dsm Sleeve   SURGEON: Joycelin Radloff J    ASSISTANT:   Eric K. [employer]  (present throughout entire procedure and necessary for timely completion of the procedure)   ANESTHESIA: GET BLOOD LOSS: 400 FLUID REPLACEMENT: 1600 crystalloid Antibiotic: 2gm Ancef Tranexamic Acid: 1gm IV COMPLICATIONS: none    INDICATIONS FOR PROCEDURE: A 54 y.o. year-old With  RIGHT HIP OSTEOARTHRITIS   for 3 years, x-rays show bone-on-bone medial pole arthritic changes. Despite conservative measures with observation, anti-inflammatory medicine, narcotics, use of a cane, has severe unremitting pain and can ambulate only a few blocks before resting.  Patient desires elective R total hip arthroplasty to decrease pain and increase function. The risks, benefits, and alternatives were discussed at length including but not limited to the risks of infection, bleeding, nerve injury, stiffness, blood clots, the need for revision surgery, cardiopulmonary complications, among others, and they were willing to proceed. Questions answered     PROCEDURE IN DETAIL: The patient was identified by armband,  received preoperative IV antibiotics in the holding area at [facility], taken to the operating room , appropriate anesthetic monitors  were attached and general endotracheal anesthesia induced. Pt was rolled into the L lateral decubitus position and fixed there with a Stulberg Mark II pelvic clamp.  The R lower extremity was then prepped and  draped  in the usual sterile fashion from the ankle to the hemipelvis. A time-out  procedure was performed. The skin along the lateral hip and thigh  infiltrated with 10 mL of 0.5% Marcaine and epinephrine solution. We  then made a posterolateral approach to the hip. With a #10 blade, a 23 cm  incision was made through the skin and subcutaneous tissue down to the level of the  IT band. Small bleeders were identified and cauterized. The IT band was cut in  line with skin incision exposing the greater trochanter. A Cobra retractor was placed between the gluteus minimus and the superior hip joint capsule, and a spiked Cobra between the quadratus femoris and the inferior hip joint capsule. This isolated the short  external rotators and piriformis tendons. These were tagged with a #2 Ethibond  suture and cut off their insertion on the intertrochanteric crest. The posterior  capsule was then developed into an acetabular-based flap from Posterior Superior off of the acetabulum out over the femoral neck and back posterior inferior to the acetabular rim. This flap was tagged with two #2 Ethibond sutures  and retracted protecting the sciatic nerve. This exposed the arthritic femoral head and osteophytes. The hip was then flexed and internally rotated, dislocating the femoral head and a standard neck cut performed 1 fingerbreadth above the lesser trochanter.  A spiked Cobra was placed in the cotyloid notch and a Hohmann retractor was then used to lever the femur anteriorly off of the anterior pelvic column. A posterior-inferior wing retractor was placed at the junction of the acetabulum and the ischium completing the acetabular exposure.We then removed the peripheral osteophytes and labrum from the acetabulum. We then reamed the acetabulum up to 50 mm with basket reamers obtaining good coverage in all quadrants. We then irrigated with normal  saline solution and hammered into place a 50 mm pinnacle cup in 45   degrees of abduction and about 25 degrees of anteversion. More  peripheral osteophytes removed and a trial 10-degree liner placed with the  index superior-posterior. The hip was then flexed and internally rotated exposing the  proximal femur, which was entered with the initiating reamer followed by  the axial reamers up to a 13.5 mm full depth and 14mm partial depth. We then conically reamed to 18D to the correct depth for a 42 base neck. The calcar was milled to 18Dsm. A trial sleeve and stem was inserted in the 20 degrees anteversion, with a +0 36mm trial head. Trial reduction was then performed and excellent stability was noted with at 90 of flexion with 75 of internal rotation and then full extension with maximal external rotation. The hip could not be dislocated in full extension. The knee could easily flex  to about 130 degrees. We also stretched the abductors at this point,  because of the preexisting adductor contractures. All trial components  were then removed. The acetabulum was irrigated out with normal saline  solution. A titanium [employer] [employer] was then screwed into place  followed by a 10-degree polyethylene liner index superior-posterior. On  the femoral side a 18Dsm ZTT1 sleeve was hammered into place, followed by a 18x13x42x150 SROM stem in 25 degrees of anteversion. At this point, a +0 36 mm ceramic head was  hammered on the stem. The hip was reduced. We checked our stability  one more time and found it to be excellent. The wound was once again  thoroughly irrigated out with normal saline solution hand lavage. The  capsular flap and short external rotators were repaired back to the  intertrochanteric crest through drill holes with a #2 Ethibond suture.  The IT band was closed with running 1 Vicryl suture. The subcutaneous  tissue with 0 and 2-0 undyed Vicryl suture and the skin with running  3-0 vicryl subcuticular suture. Aquacil dressing was applied. The patient was  then unclamped, rolled supine, awaken extubated and taken to recovery room without difficulty in stable condition.   Betty Mejia J 07/22/2014, 8:52 AM

## 2014-07-22 NOTE — Anesthesia Preprocedure Evaluation (Signed)
Anesthesia Evaluation  Patient identified by MRN, date of birth, ID band Patient awake    Reviewed: Allergy & Precautions, NPO status , Patient's Chart, lab work & pertinent test results  Airway Mallampati: II  TM Distance: >3 FB Neck ROM: Full    Dental no notable dental hx.    Pulmonary neg pulmonary ROS,  breath sounds clear to auscultation  Pulmonary exam normal       Cardiovascular negative cardio ROS  Rhythm:Regular Rate:Normal     Neuro/Psych negative neurological ROS  negative psych ROS   GI/Hepatic negative GI ROS, Neg liver ROS,   Endo/Other  Hypothyroidism   Renal/GU negative Renal ROS  negative genitourinary   Musculoskeletal negative musculoskeletal ROS (+)   Abdominal   Peds negative pediatric ROS (+)  Hematology negative hematology ROS (+)   Anesthesia Other Findings   Reproductive/Obstetrics negative OB ROS                             Anesthesia Physical Anesthesia Plan  ASA: II  Anesthesia Plan: General   Post-op Pain Management:    Induction: Intravenous  Airway Management Planned: Oral ETT  Additional Equipment:   Intra-op Plan:   Post-operative Plan: Extubation in OR  Informed Consent: I have reviewed the patients History and Physical, chart, labs and discussed the procedure including the risks, benefits and alternatives for the proposed anesthesia with the patient or authorized representative who has indicated his/her understanding and acceptance.   Dental advisory given  Plan Discussed with: CRNA and Surgeon  Anesthesia Plan Comments:         Anesthesia Quick Evaluation

## 2014-07-22 NOTE — Anesthesia Procedure Notes (Signed)
Procedure Name: Intubation Date/Time: 07/22/2014 7:38 AM Performed by: Gavin PoundLOWDER, Ronney Honeywell J Pre-anesthesia Checklist: Patient identified, Timeout performed, Emergency Drugs available, Suction available and Patient being monitored Patient Re-evaluated:Patient Re-evaluated prior to inductionOxygen Delivery Method: Circle system utilized Preoxygenation: Pre-oxygenation with 100% oxygen Intubation Type: IV induction Ventilation: Mask ventilation without difficulty Laryngoscope Size: Mac and 3 Grade View: Grade I Tube type: Oral Tube size: 7.0 mm Number of attempts: 1 Placement Confirmation: ETT inserted through vocal cords under direct vision,  breath sounds checked- equal and bilateral and positive ETCO2 Secured at: 21 cm Tube secured with: Tape

## 2014-07-22 NOTE — Evaluation (Signed)
Physical Therapy Evaluation Patient Details Name: Betty Mejia MRN: [id number] DOB: 11/28/1960 Today's Date: 07/22/2014   History of Present Illness  Pt is a 54 y/o F s/p R THA, posterior approach.  Pt's PMH includes back surgery w/ spinal cord stimulator, trigeminal neuralgia, and anxiety.  Clinical Impression  Pt is s/p R THA resulting in the deficits listed below (see PT Problem List).  Session limited to stand pivot transfer 2/2 lethargy and pain in R hip.  Depending on pt's progress pt may benefit from short term SNF prior to d/c home 2/2 pt's 30 steps to enter home and generalized weakness in BLEs 2/2 baseline N/T in BLEs.  Pt will need to show improvements in her endurance and BLE strength prior to attempting stairs.  Pt will benefit from skilled PT to increase their independence and safety with mobility to allow discharge to the venue listed below.      Follow Up Recommendations SNF;Home health PT;Supervision for mobility/OOB (depending on pt's progress)    Equipment Recommendations  Rolling walker with 5" wheels;3in1 (PT)    Recommendations for Other Services       Precautions / Restrictions Precautions Precautions: Posterior Hip;Fall Precaution Booklet Issued: Yes (comment) Precaution Comments: Reviewed post hip precautions Restrictions Weight Bearing Restrictions: Yes RLE Weight Bearing: Weight bearing as tolerated      Mobility  Bed Mobility Overal bed mobility: Needs Assistance Bed Mobility: Supine to Sit     Supine to sit: Min assist     General bed mobility comments: Min A w/ managing BLEs to sit EOB.  Mod use of bed rails and verbal cues for sequencing.  Transfers Overall transfer level: Needs assistance Equipment used: Rolling walker (2 wheeled) Transfers: Sit to/from [employer] Sit to Stand: Min assist Stand pivot transfers: Min guard       General transfer comment: Min A to stand upright and verbal cues for proper hand placement  and to push through BLEs  Ambulation/Gait             General Gait Details: pivotal steps only  Information systems manager Rankin (Stroke Patients Only)       Balance Overall balance assessment: Needs assistance Sitting-balance support: Bilateral upper extremity supported;Feet supported Sitting balance-Leahy Scale: Poor   Postural control: Posterior lean Standing balance support: Bilateral upper extremity supported;During functional activity Standing balance-Leahy Scale: Poor                               Pertinent Vitals/Pain Pain Assessment: 0-10 Pain Score: 7  Pain Location: R hip Pain Descriptors / Indicators: Aching;Nagging;Grimacing;Guarding;Moaning (w/ mobility) Pain Intervention(s): Limited activity within patient's tolerance;Monitored during session;Repositioned    Home Living Family/patient expects to be discharged to:: Private residence Living Arrangements: Alone Available Help at Discharge: Friend(s);Available 24 hours/day (children will rotate shifts to help mother) Type of Home: House (townhouse) Home Access: Stairs to enter Entrance Stairs-Rails: Can reach both Entrance Stairs-Number of Steps: 30 (3 flights, 3rd floor townhome) Home Layout: One level Home Equipment: Cane - single point      Prior Function Level of Independence: Independent with assistive device(s)         Comments:  (Using cane at all times)     Hand Dominance   Dominant Hand: Right    Extremity/Trunk Assessment  Lower Extremity Assessment: RLE deficits/detail;LLE deficits/detail RLE Deficits / Details: weakness and limited ROM as expected s/p R THA    Cervical / Trunk Assessment: Normal  Communication   Communication: No difficulties  Cognition Arousal/Alertness: Lethargic Behavior During Therapy: Flat affect Overall Cognitive Status: Within Functional Limits for tasks assessed                       General Comments General comments (skin integrity, edema, etc.): Session limited 2/2 pt's lethargic state    Exercises Total Joint Exercises Ankle Circles/Pumps: AROM;Both;10 reps;Supine Quad Sets: AROM;Both;10 reps;Supine Heel Slides: AAROM;Right;5 reps;Supine      Assessment/Plan    PT Assessment Patient needs continued PT services  PT Diagnosis Difficulty walking;Abnormality of gait;Generalized weakness;Acute pain   PT Problem List Decreased strength;Decreased range of motion;Decreased activity tolerance;Decreased balance;Decreased mobility;Decreased coordination;Decreased knowledge of use of DME;Decreased safety awareness;Decreased knowledge of precautions;Impaired sensation;Decreased skin integrity;Pain  PT Treatment Interventions DME instruction;Gait training;Stair training;Functional mobility training;Therapeutic activities;Therapeutic exercise;Balance training;Neuromuscular re-education;Patient/family education;Modalities   PT Goals (Current goals can be found in the Care Plan section) Acute Rehab PT Goals Patient Stated Goal: to get stronger so she can go home PT Goal Formulation: With patient/family Time For Goal Achievement: 07/29/14 Potential to Achieve Goals: Good    Frequency 7X/week   Barriers to discharge Inaccessible home environment 30 steps to enter home    Co-evaluation               End of Session Equipment Utilized During Treatment: Gait belt Activity Tolerance: Patient tolerated treatment well;Patient limited by fatigue;Patient limited by lethargy Patient left: in chair;with call bell/phone within reach;with family/visitor present Nurse Communication: Mobility status;Precautions;Weight bearing status         Time: [id number] PT Time Calculation (min) (ACUTE ONLY): 27 min   Charges:   PT Evaluation $Initial PT Evaluation Tier I: 1 Procedure PT Treatments $Therapeutic Exercise: 8-22 mins   PT G Codes:       Michail JewelsAshley Parr PT, DPT  [phone] Pager: [phone] 07/22/2014, 5:27 PM

## 2014-07-22 NOTE — Anesthesia Postprocedure Evaluation (Signed)
  Anesthesia Post-op Note  Patient: Betty Mejia  Procedure(s) Performed: Procedure(s) (LRB): TOTAL HIP ARTHROPLASTY (Right)  Patient Location: PACU  Anesthesia Type: General  Level of Consciousness: awake and alert   Airway and Oxygen Therapy: Patient Spontanous Breathing  Post-op Pain: mild  Post-op Assessment: Post-op Vital signs reviewed, Patient's Cardiovascular Status Stable, Respiratory Function Stable, Patent Airway and No signs of Nausea or vomiting  Last Vitals:  Filed Vitals:   07/22/14 0952  BP: 135/87  Pulse: 70  Temp:   Resp: 17    Post-op Vital Signs: stable   Complications: No apparent anesthesia complications

## 2014-07-22 NOTE — Discharge Instructions (Signed)

## 2014-07-22 NOTE — Progress Notes (Signed)
Utilization review completed.  

## 2014-07-22 NOTE — Transfer of Care (Signed)
Immediate Anesthesia Transfer of Care Note  Patient: Betty NimsBarbara Shuler  Procedure(s) Performed: Procedure(s): TOTAL HIP ARTHROPLASTY (Right)  Patient Location: PACU  Anesthesia Type:General  Level of Consciousness: awake and alert   Airway & Oxygen Therapy: Patient Spontanous Breathing and Patient connected to nasal cannula oxygen  Post-op Assessment: Report given to RN and Post -op Vital signs reviewed and stable  Post vital signs: Reviewed and stable  Last Vitals:  Filed Vitals:   07/22/14 0605  BP: 117/64  Pulse: 73  Temp: 36.6 C  Resp: 18    Complications: No apparent anesthesia complications

## 2014-07-22 NOTE — Interval H&P Note (Signed)
History and Physical Interval Note:  07/22/2014 7:13 AM  Betty Mejia  has presented today for surgery, with the diagnosis of RIGHT HIP OSTEOARTHRITIS  The various methods of treatment have been discussed with the patient and family. After consideration of risks, benefits and other options for treatment, the patient has consented to  Procedure(s): TOTAL HIP ARTHROPLASTY (Right) as a surgical intervention .  The patient's history has been reviewed, patient examined, no change in status, stable for surgery.  I have reviewed the patient's chart and labs.  Questions were answered to the patient's satisfaction.     Nestor LewandowskyOWAN,Junius Faucett J

## 2014-07-23 ENCOUNTER — Encounter (HOSPITAL_COMMUNITY): Payer: Self-pay | Admitting: Orthopedic Surgery

## 2014-07-23 LAB — CBC
HCT: 32 % — ABNORMAL LOW (ref 36.0–46.0)
HEMOGLOBIN: 10.5 g/dL — AB (ref 12.0–15.0)
MCH: 32 pg (ref 26.0–34.0)
MCHC: 32.8 g/dL (ref 30.0–36.0)
MCV: 97.6 fL (ref 78.0–100.0)
Platelets: 223 10*3/uL (ref 150–400)
RBC: 3.28 MIL/uL — ABNORMAL LOW (ref 3.87–5.11)
RDW: 13.6 % (ref 11.5–15.5)
WBC: 11.1 10*3/uL — ABNORMAL HIGH (ref 4.0–10.5)

## 2014-07-23 NOTE — Clinical Social Work Note (Signed)
Per medical team, patient and patient's family refusing SNF placement at this time. RNCM following for home health services at discharge. CSW signing off.  Betty Mejia, [place] Cell: [phone]       Fax: [phone] Clinical Social Work: Orthopedics [phone]([id number]) and Surgical [phone]([id number])

## 2014-07-23 NOTE — Progress Notes (Signed)
Patient ID: Betty NimsBarbara Mejia, female   DOB: [birth year], 54 y.o.   MRN: [id number] PATIENT ID: Betty NimsBarbara Mejia  MRN: [id number]  DOB/AGE:  [birth year] / 54 y.o.  1 Day Post-Op Procedure(s) (LRB): TOTAL HIP ARTHROPLASTY (Right)    PROGRESS NOTE Subjective: Patient is alert, oriented, no Nausea, no Vomiting, yes passing gas, no Bowel Movement. Taking PO well. Denies SOB, Chest or Calf Pain. Using Incentive Spirometer, PAS in place. Ambulate WBAT, Stood to pivot with PT yesterday Patient reports pain as 6 on 0-10 scale  .    Objective: Vital signs in last 24 hours: Filed Vitals:   07/23/14 0000 07/23/14 0136 07/23/14 0312 07/23/14 0601  BP:  105/59  93/48  Pulse:  92  86  Temp:  98.6 F (37 C)  98.8 F (37.1 C)  TempSrc:      Resp: 16 16 16 16   SpO2: 100% 98% 98% 96%      Intake/Output from previous day: I/O last 3 completed shifts: In: 2540 [P.O.:490; I.V.:2050] Out: 50 [Blood:50]   Intake/Output this shift:     LABORATORY DATA: No results for input(s): WBC, HGB, HCT, PLT, NA, K, CL, CO2, BUN, CREATININE, GLUCOSE, GLUCAP, INR, CALCIUM in the last 72 hours.  Invalid input(s): PT, 2  Examination: Neurologically intact ABD soft Neurovascular intact Sensation intact distally Intact pulses distally Dorsiflexion/Plantar flexion intact Incision: dressing C/D/I No cellulitis present Compartment soft} XR AP&Lat of hip shows well placed\fixed THA  Assessment:   1 Day Post-Op Procedure(s) (LRB): TOTAL HIP ARTHROPLASTY (Right) ADDITIONAL DIAGNOSIS:  Expected Acute Blood Loss Anemia, Hypothyroidism  Plan: PT/OT WBAT, THA  posterior precautions  DVT Prophylaxis: SCDx72 hrs, ASA 325 mg BID x 2 weeks  DISCHARGE PLAN: Home  DISCHARGE NEEDS: HHPT, Walker and 3-in-1 comode seat

## 2014-07-23 NOTE — Progress Notes (Signed)
Physical Therapy Treatment Patient Details Name: Betty Mejia MRN: [id number] DOB: 1960-09-18 Today's Date: 07/23/2014    History of Present Illness Pt is a 54 y/o F s/p R THA, posterior approach.  Pt's PMH includes back surgery w/ spinal cord stimulator, trigeminal neuralgia, and anxiety.    PT Comments    Pt is making progress towards goals and increasing functional independence. Able to progress ambulation this session. Focus on progressive ambulation and possibly stair training next session as pt is able. Continue with POC.  Follow Up Recommendations  SNF;Home health PT;Supervision for mobility/OOB     Equipment Recommendations  Rolling walker with 5" wheels;3in1 (PT)    Recommendations for Other Services       Precautions / Restrictions Precautions Precautions: Posterior Hip;Fall Precaution Comments: Reviewed post hip precautions with pt and family. Pt able to recall 3/3 precautions with assist from family. Restrictions Weight Bearing Restrictions: Yes RLE Weight Bearing: Weight bearing as tolerated    Mobility  Bed Mobility               General bed mobility comments: Pt in chair before and after.  Transfers Overall transfer level: Needs assistance Equipment used: Rolling walker (2 wheeled) Transfers: Sit to/from Stand Sit to Stand: Min guard         General transfer comment: Cues for hand placement and position of RLE.  Ambulation/Gait Ambulation/Gait assistance: Min guard Ambulation Distance (Feet): 150 Feet Assistive device: Rolling walker (2 wheeled) Gait Pattern/deviations: Step-through pattern;Decreased stance time - left;Antalgic;Decreased stride length   Gait velocity interpretation: Below normal speed for age/gender General Gait Details: Pt beginning to ambulate with step-through pattern. Cues to keep RW moving and bend knees.   Stairs            Wheelchair Mobility    Modified Rankin (Stroke Patients Only)       Balance                                     Cognition Arousal/Alertness: Awake/alert Behavior During Therapy: WFL for tasks assessed/performed Overall Cognitive Status: Within Functional Limits for tasks assessed                      Exercises Total Joint Exercises Quad Sets: AROM;Right;10 reps Gluteal Sets: AROM;Both;10 reps Heel Slides: AAROM;Right;5 reps Hip ABduction/ADduction: AAROM;Right;5 reps Long Arc Quad: AAROM;Right;10 reps    General Comments        Pertinent Vitals/Pain Pain Assessment: 0-10 Pain Score: 6  Pain Location: R hip and groin area Pain Descriptors / Indicators: Sore;Aching;Throbbing;Grimacing;Guarding Pain Intervention(s): Monitored during session;Ice applied    Home Living                      Prior Function            PT Goals (current goals can now be found in the care plan section) Progress towards PT goals: Progressing toward goals    Frequency  7X/week    PT Plan Current plan remains appropriate    Co-evaluation             End of Session Equipment Utilized During Treatment: Gait belt Activity Tolerance: Patient limited by pain Patient left: in chair;with call bell/phone within reach;with family/visitor present     Time: 1400-1430 PT Time Calculation (min) (ACUTE ONLY): 30 min  Charges:  $Gait Training: 8-22 mins $Therapeutic Exercise: 8-22  mins                    G Codes:      Leonard SchwartzRumley, Nyliah Nierenberg, Leda GauzeSPTA 07/23/2014, 2:40 PM

## 2014-07-23 NOTE — Care Management Note (Signed)
CARE MANAGEMENT NOTE 07/23/2014  Patient:  Dellia NimsSHULER,Kadija   Account Number:  [id number]  Date Initiated:  07/23/2014  Documentation initiated by:  Vance PeperBRADY,Nickolus Wadding  Subjective/Objective Assessment:   54 yr old female admitted with right hip osteoarthritis. patient underwent a right total hip arthroplasty.     Action/Plan:   Case manager spoke with patient concerning home health and DME . patient does not want to go to snf. States family will assist day and night. Son-in-law will assist her up stairs .   Anticipated DC Date:  07/25/2014   Anticipated DC Plan:  HOME W HOME HEALTH SERVICES      DC Planning Services  CM consult      [facility]  [facility] HEALTH  DURABLE MEDICAL EQUIPMENT   Choice offered to / List presented to:  C-1 Patient   DME arranged  [facility]  3-N-1      DME agency  [facility]     HH arranged  HH-2 PT  HH-3 OT      [facility]   Status of service:  Completed, signed off Medicare Important Message given?   (If response is "NO", the following Medicare IM given date fields will be blank) Date Medicare IM given:   Medicare IM given by:   Date Additional Medicare IM given:   Additional Medicare IM given by:    Discharge Disposition:  HOME W HOME HEALTH SERVICES  Per UR Regulation:  Reviewed for med. necessity/level of care/duration of stay  If discussed at Long Length of Stay Meetings, dates discussed:    Comments:  07/23/14 10:00am Vance PeperSusan Pierra Skora, RN BSN case Manager Referral was called to [place], [facility] L[facility].

## 2014-07-23 NOTE — Evaluation (Signed)
Occupational Therapy Evaluation Patient Details Name: Betty Mejia MRN: [id number] DOB: [birth year] Today's Date: 07/23/2014    History of Present Illness Pt is a 53 y/o F s/p R THA, posterior approach.  Pt's PMH includes back surgery w/ spinal cord stimulator, trigeminal neuralgia, and anxiety.   Clinical Impression   Patient mod I PTA. Patient currently functioning at an overall min guard>supervision level, mod assist for LB ADLs. Patient will benefit from acute OT to increase overall independence in the areas of ADLs, functional mobility, and overall safety in order to safely discharge to venue listed below. Believe ST-SNF is safest discharge plan for patient at this time. IF patient refuses SNF, recommend HHOT.     Follow Up Recommendations  SNF;Supervision/Assistance - 24 hour    Equipment Recommendations  Other (comment) (TBD, next venue of care)    Recommendations for Other Services  None at this time   Precautions / Restrictions Precautions Precautions: Posterior Hip;Fall Precaution Comments: Reviewed post hip precautions and handout given Restrictions Weight Bearing Restrictions: Yes RLE Weight Bearing: Weight bearing as tolerated      Mobility Bed Mobility General bed mobility comments: Patient found in BR with NT, please see PT note for more information   Transfers Overall transfer level: Needs assistance Equipment used: Rolling walker (2 wheeled) Transfers: Sit to/from Stand Sit to Stand: Min guard  General transfer comment: Cues required for safety, technique, and hand placement     Balance Overall balance assessment: Needs assistance Sitting-balance support: Bilateral upper extremity supported;Feet supported Sitting balance-Leahy Scale: Fair     Standing balance support: Bilateral upper extremity supported;During functional activity Standing balance-Leahy Scale: Fair    ADL Overall ADL's : Needs assistance/impaired Eating/Feeding: Set up;Sitting    Grooming: Supervision/safety;Standing   Upper Body Bathing: Supervision/ safety;Sitting   Lower Body Bathing: Moderate assistance;Sit to/from stand;Cueing for safety   Upper Body Dressing : Supervision/safety;Sitting   Lower Body Dressing: Moderate assistance;Cueing for safety;Sit to/from stand   Toilet Transfer: Min guard;BSC;RW;Ambulation   Toileting- Architect and Hygiene: Supervision/safety;Sit to/from stand       Functional mobility during ADLs: Min guard;Rolling walker General ADL Comments: Patient currently requires up to min guard for functional mobility and tasks, mod assist for LB ADLs. Educated patient on use of AE to increase independence with this. Also re-educated patient on hip precautions, patient with no recollection of PT going over hip precautions yesterday. Patient with 30 steps to get into apartment and according to daughter the scheduled help may or may not be there. Believe patient will benefit from ST-SNF prior to d/c>home to increase her overall independence and overall safety for a safe transition home.     Pertinent Vitals/Pain Pain Assessment: Faces Faces Pain Scale: Hurts even more Pain Location: Right hip Pain Descriptors / Indicators: Grimacing Pain Intervention(s): Limited activity within patient's tolerance;Monitored during session;Repositioned     Hand Dominance Right   Extremity/Trunk Assessment Upper Extremity Assessment Upper Extremity Assessment: Overall WFL for tasks assessed   Lower Extremity Assessment Lower Extremity Assessment: Defer to PT evaluation   Cervical / Trunk Assessment Cervical / Trunk Assessment: Normal   Communication Communication Communication: No difficulties   Cognition Arousal/Alertness: Lethargic;Suspect due to medications Behavior During Therapy: Flat affect Overall Cognitive Status: Within Functional Limits for tasks assessed              Home Living Family/patient expects to be discharged  to:: Private residence Living Arrangements: Alone Available Help at Discharge: Friend(s);Available 24 hours/day (children and family plan  on rotating shifts to assist) Type of Home: Apartment (townhouse) Home Access: Stairs to enter [employer] CorporationEntrance Stairs-Number of Steps: 30 (3 flights. 3rd floor apartment) Entrance Stairs-Rails: Can reach both Home Layout: One level     Bathroom Shower/Tub: Tub/shower unit;Curtain   FirefighterBathroom Toilet: Standard     Home Equipment: Cane - single point   Prior Functioning/Environment Level of Independence: Independent with assistive device(s)        Comments: used cane for mobility PTA    OT Diagnosis: Generalized weakness;Acute pain   OT Problem List: Decreased strength;Decreased activity tolerance;Impaired balance (sitting and/or standing);Decreased safety awareness;Decreased knowledge of use of DME or AE;Decreased knowledge of precautions;Pain   OT Treatment/Interventions: Self-care/ADL training;Therapeutic exercise;Energy conservation;DME and/or AE instruction;Therapeutic activities;Balance training;Patient/family education    OT Goals(Current goals can be found in the care plan section) Acute Rehab OT Goals Patient Stated Goal: get stronger OT Goal Formulation: With patient/family Time For Goal Achievement: 07/30/14 Potential to Achieve Goals: Good ADL Goals Pt Will Perform Grooming: with modified independence;standing Pt Will Perform Upper Body Bathing: Independently;sitting Pt Will Perform Lower Body Bathing: with min assist;sit to/from stand;with adaptive equipment Pt Will Perform Upper Body Dressing: Independently;sitting Pt Will Perform Lower Body Dressing: with min assist;sit to/from stand;with adaptive equipment Pt Will Transfer to Toilet: with modified independence;bedside commode;ambulating Pt Will Perform Toileting - Clothing Manipulation and hygiene: with modified independence;sit to/from stand Pt Will Perform Tub/Shower Transfer: with  supervision;rolling walker;ambulating;3 in 1 Additional ADL Goal #1: Patient will independently verbalize and adhere to 3/3 hip precautions 100% of the time during functional mobility and self-care tasks   OT Frequency: Min 2X/week   Barriers to D/C: None known at this time, family states someone will be with her 24/7   End of Session Equipment Utilized During Treatment: Rolling walker  Activity Tolerance: Patient limited by lethargy Patient left: in chair;with call bell/phone within reach;with family/visitor present   Time: [id number] OT Time Calculation (min): 22 min Charges:  OT General Charges $OT Visit: 1 Procedure OT Evaluation $Initial OT Evaluation Tier I: 1 Procedure  Rian Busche , MS, OTR/L, CLT Pager: [phone]  07/23/2014, 8:57 AM

## 2014-07-23 NOTE — Progress Notes (Signed)
Physical Therapy Treatment Patient Details Name: Betty NimsBarbara Mejia MRN: [id number] DOB: [birth year] Today's Date: 07/23/2014    History of Present Illness Pt is a 54 y/o F s/p R THA, posterior approach.  Pt's PMH includes back surgery w/ spinal cord stimulator, trigeminal neuralgia, and anxiety.    PT Comments    Pt is making progress towards goals, although progress is slow due to pain and lethargy. Pt still wants to D/C home, insisting that she will have assistance at home and feels confident that she will be able to go up/down flight of stairs to get into home. Focus on progressive ambulation and stair training for next session as pt is able. Continue with POC.  Follow Up Recommendations  SNF;Home health PT;Supervision for mobility/OOB     Equipment Recommendations  Rolling walker with 5" wheels;3in1 (PT)    Recommendations for Other Services       Precautions / Restrictions Precautions Precautions: Posterior Hip;Fall Precaution Comments: Reviewed post hip precautions. Pt able to recall 1/3 precautions. Cues for hip adduction and rotation precautions. Restrictions Weight Bearing Restrictions: Yes RLE Weight Bearing: Weight bearing as tolerated    Mobility  Bed Mobility               General bed mobility comments: Pt in chair before and after.  Transfers Overall transfer level: Needs assistance Equipment used: Rolling walker (2 wheeled) Transfers: Sit to/from Stand Sit to Stand: Min guard         General transfer comment: Cues for safety and position of RLE.  Ambulation/Gait Ambulation/Gait assistance: Min guard Ambulation Distance (Feet): 50 Feet Assistive device: Rolling walker (2 wheeled) Gait Pattern/deviations: Step-to pattern;Decreased stance time - right;Antalgic;Decreased stride length     General Gait Details: Cues for bending knees. Pt c/o R hip pain during ambulation.   Stairs            Wheelchair Mobility    Modified Rankin (Stroke  Patients Only)       Balance Overall balance assessment: Needs assistance Sitting-balance support: Bilateral upper extremity supported;Feet supported Sitting balance-Leahy Scale: Fair     Standing balance support: Bilateral upper extremity supported;During functional activity Standing balance-Leahy Scale: Fair                      Cognition Arousal/Alertness: Lethargic;Suspect due to medications Behavior During [facility] tasks assessed/performed Overall Cognitive Status: Within Functional Limits for tasks assessed                      Exercises Total Joint Exercises Quad Sets: AROM;Right;10 reps Gluteal Sets: AROM;Both;10 reps Heel Slides: AAROM;Right;5 reps Hip ABduction/ADduction: AAROM;Right;5 reps Long Arc Quad: AAROM;Right;10 reps    General Comments        Pertinent Vitals/Pain Pain Assessment: 0-10 Pain Score: 7  Faces Pain Scale: Hurts even more Pain Location: R hip Pain Descriptors / Indicators: Aching;Sore;Throbbing;Grimacing Pain Intervention(s): Limited activity within patient's tolerance;Monitored during session;Ice applied    Home Living Family/patient expects to be discharged to:: Private residence Living Arrangements: Alone Available Help at Discharge: Friend(s);Available 24 hours/day (children and family plan on rotating shifts to assist) Type of Home: Apartment (townhouse) Home Access: Stairs to enter Entrance Stairs-Rails: Can reach both Home Layout: One level Home Equipment: Cane - single point      Prior Function Level of Independence: Independent with assistive device(s)      Comments: used cane for mobility PTA   PT Goals (current goals can now be  found in the care plan section) Acute Rehab PT Goals Patient Stated Goal: get stronger Progress towards PT goals: Progressing toward goals    Frequency  7X/week    PT Plan Current plan remains appropriate    Co-evaluation             End of Session  Equipment Utilized During Treatment: Gait belt Activity Tolerance: Patient limited by pain;Patient limited by lethargy Patient left: in chair;with call bell/phone within reach;with family/visitor present     Time: 0902-0929 PT Time Calculation (min) (ACUTE ONLY): 27 min  Charges:                       G CodesLeonard Schwartz, SPTA 07/23/2014, 10:40 AM

## 2014-07-24 LAB — CBC
HEMATOCRIT: 26 % — AB (ref 36.0–46.0)
HEMOGLOBIN: 8.6 g/dL — AB (ref 12.0–15.0)
MCH: 32.7 pg (ref 26.0–34.0)
MCHC: 33.1 g/dL (ref 30.0–36.0)
MCV: 98.9 fL (ref 78.0–100.0)
Platelets: 189 10*3/uL (ref 150–400)
RBC: 2.63 MIL/uL — ABNORMAL LOW (ref 3.87–5.11)
RDW: 13.9 % (ref 11.5–15.5)
WBC: 11.1 10*3/uL — ABNORMAL HIGH (ref 4.0–10.5)

## 2014-07-24 NOTE — Discharge Summary (Signed)
Patient ID: Dellia NimsBarbara Shuler MRN: [id number] DOB/AGE: 08-05-1960 54 y.o.  Admit date: 07/22/2014 Discharge date: 07/24/2014  Admission Diagnoses:  Principal Problem:   Primary osteoarthritis of right hip   Discharge Diagnoses:  Same  Past Medical History  Diagnosis Date  . Trigeminal neuralgia   . Thyroid disease   . Back pain   . Arthritis   . Hypothyroidism   . Anxiety     Surgeries: Procedure(s): TOTAL HIP ARTHROPLASTY on 07/22/2014   Consultants:    Discharged Condition: Improved  Hospital Course: Dellia NimsBarbara Shuler is an 54 y.o. female who was admitted 07/22/2014 for operative treatment ofPrimary osteoarthritis of right hip. Patient has severe unremitting pain that affects sleep, daily activities, and work/hobbies. After pre-op clearance the patient was taken to the operating room on 07/22/2014 and underwent  Procedure(s): TOTAL HIP ARTHROPLASTY.    Patient was given perioperative antibiotics: Anti-infectives    Start     Dose/Rate Route Frequency Ordered Stop   07/22/14 0600  ceFAZolin (ANCEF) IVPB 2 g/50 mL premix     2 g 100 mL/hr over 30 Minutes Intravenous On call to O.R. 07/21/14 1445 07/22/14 [id number]       Patient was given sequential compression devices, early ambulation, and chemoprophylaxis to prevent DVT.  Patient benefited maximally from hospital stay and there were no complications.    Recent vital signs: Patient Vitals for the past 24 hrs:  BP Temp Pulse Resp SpO2  07/24/14 0548 (!) 90/44 mmHg 98.6 F (37 C) 99 18 95 %  07/24/14 0400 - - - 18 100 %  07/24/14 0000 - - - 18 100 %  07/23/14 2055 (!) 133/57 mmHg 99.1 F (37.3 C) (!) 101 18 100 %  07/23/14 2000 - - - 18 100 %  07/23/14 1300 (!) 111/52 mmHg 98.6 F (37 C) 89 18 99 %     Recent laboratory studies:  Recent Labs  07/23/14 1200 07/24/14 0540  WBC 11.1* 11.1*  HGB 10.5* 8.6*  HCT 32.0* 26.0*  PLT 223 189     Discharge Medications:     Medication List    STOP taking these  medications        nabumetone 750 MG tablet  Commonly known as:  RELAFEN      TAKE these medications        aspirin EC 325 MG tablet  Take 1 tablet (325 mg total) by mouth 2 (two) times daily.     carbamazepine 200 MG tablet  Commonly known as:  TEGRETOL  Take 200-300 mg by mouth 2 (two) times daily.     levothyroxine 75 MCG tablet  Commonly known as:  SYNTHROID, LEVOTHROID  Take 75 mcg by mouth daily before breakfast.     methocarbamol 500 MG tablet  Commonly known as:  ROBAXIN  Take 1 tablet (500 mg total) by mouth 2 (two) times daily with a meal.     oxyCODONE-acetaminophen 5-325 MG per tablet  Commonly known as:  ROXICET  Take 1 tablet by mouth every 4 (four) hours as needed.     pregabalin 75 MG capsule  Commonly known as:  LYRICA  Take 75 mg by mouth 2 (two) times daily.     VITAMIN D PO  Take 1 tablet by mouth daily.        Diagnostic Studies: Dg Chest 2 View  07/11/2014   CLINICAL DATA:  For right total hip arthroplasty  EXAM: CHEST  2 VIEW  COMPARISON:  Chest x-ray of 06/09/2013  FINDINGS: No active infiltrate or effusion is seen. Mediastinal and hilar contours are unremarkable. The heart is within upper limits of normal. Neurostimulator electrode leads overlie the lower thoracic spinal canal.  IMPRESSION: No active cardiopulmonary disease. Neurostimulator leads as noted above.   Electronically Signed   By: Dwyane Dee M.D.   On: 07/11/2014 14:42   Dg Pelvis Portable  07/22/2014   CLINICAL DATA:  Postop from right hip replacement.  EXAM: PORTABLE PELVIS 1-2 VIEWS  COMPARISON:  None.  FINDINGS: Bipolar right hip prosthesis is seen in expected position. No evidence of fracture, dislocation, or other complication.  IMPRESSION: Expected postoperative appearance of right hip prosthesis. No acute fracture or other complication identified.   Electronically Signed   By: Myles Rosenthal M.D.   On: 07/22/2014 10:30    Disposition: 01-Home or Self Care      Discharge  Instructions    Call MD / Call 911    Complete by:  As directed   If you experience chest pain or shortness of breath, CALL 911 and be transported to the hospital emergency room.  If you develope a fever above 101 F, pus (white drainage) or increased drainage or redness at the wound, or calf pain, call your surgeon's office.     Constipation Prevention    Complete by:  As directed   Drink plenty of fluids.  Prune juice may be helpful.  You may use a stool softener, such as Colace (over the counter) 100 mg twice a day.  Use MiraLax (over the counter) for constipation as needed.     Diet - low sodium heart healthy    Complete by:  As directed      Discharge instructions    Complete by:  As directed   Follow up in office with Dr.Rowan in 2 weeks.     Driving restrictions    Complete by:  As directed   No driving for 2 weeks     Follow the hip precautions as taught in Physical Therapy    Complete by:  As directed      Increase activity slowly as tolerated    Complete by:  As directed      Patient may shower    Complete by:  As directed   You may shower without a dressing once there is no drainage.  Do not wash over the wound.  If drainage remains, cover wound with plastic wrap and then shower.           Follow-up Information    Follow up with Nestor Lewandowsky, MD In 2 weeks.   Specialty:  Orthopedic Surgery   Contact information:   [address] [place] [address] [phone]       Follow up with [facility].   Why:  Someone from [facility] will contact you concerning start time for therapy.   Contact information:   [address] [place] [address] [phone]        Signed: Vear Clock, Gorden Stthomas R 07/24/2014, 8:20 AM

## 2014-07-24 NOTE — Progress Notes (Signed)
PATIENT ID: Betty Mejia  MRN: [id number]  DOB/AGE:  1960-12-09 / 54 y.o.  2 Days Post-Op Procedure(s) (LRB): TOTAL HIP ARTHROPLASTY (Right)    PROGRESS NOTE Subjective: Patient is alert, oriented, no Nausea, no Vomiting, yes passing gas, no Bowel Movement. Taking PO well with pt up eating in bed. Denies SOB, Chest or Calf Pain. Using Incentive Spirometer, PAS in place. Ambulate WBAT with Pt walking 150 ft with therapy. Patient reports pain as 4 on 0-10 scale  .    Objective: Vital signs in last 24 hours: Filed Vitals:   07/23/14 2055 07/24/14 0000 07/24/14 0400 07/24/14 0548  BP: 133/57   90/44  Pulse: 101   99  Temp: 99.1 F (37.3 C)   98.6 F (37 C)  TempSrc:      Resp: 18 18 18 18   SpO2: 100% 100% 100% 95%      Intake/Output from previous day: I/O last 3 completed shifts: In: 2215 [P.O.:840; I.V.:1375] Out: -    Intake/Output this shift:     LABORATORY DATA:  Recent Labs  07/23/14 1200 07/24/14 0540  WBC 11.1* 11.1*  HGB 10.5* 8.6*  HCT 32.0* 26.0*  PLT 223 189    Examination: Neurologically intact Neurovascular intact Sensation intact distally Intact pulses distally Dorsiflexion/Plantar flexion intact Incision: dressing C/D/I No cellulitis present Compartment soft} XR AP&Lat of hip shows well placed\fixed THA  Assessment:   2 Days Post-Op Procedure(s) (LRB): TOTAL HIP ARTHROPLASTY (Right) ADDITIONAL DIAGNOSIS:  Expected Acute Blood Loss Anemia, hypothyroidism  Plan: PT/OT WBAT, THA  posterior precautions  DVT Prophylaxis: SCDx72 hrs, ASA 325 mg BID x 2 weeks  DISCHARGE PLAN: Home, later today with expected late discharge  DISCHARGE NEEDS: HHPT, HHRN, Walker and 3-in-1 comode seat

## 2014-07-24 NOTE — Progress Notes (Signed)
Physical Therapy Treatment Patient Details Name: Betty Mejia MRN: [id number] DOB: [birth year] Today's Date: 07/24/2014    History of Present Illness Pt is a 54 y/o F s/p R THA, posterior approach.  Pt's PMH includes back surgery w/ spinal cord stimulator, trigeminal neuralgia, and anxiety.    PT Comments    Pt is making progress towards goals and increasing functional independence. Pt able to progress ambulation and complete stair training this AM with family present. Pt safe with technique and proper sequencing going up/down stairs. Both pt and family felt comfortable with pt going up/down flight of steps at home. Pt safe to D/C from a mobility standpoint based on progression towards goals set on PT eval.   Follow Up Recommendations  Home health PT;Supervision for mobility/OOB     Equipment Recommendations  Rolling walker with 5" wheels;3in1 (PT)    Recommendations for Other Services       Precautions / Restrictions Precautions Precautions: Posterior Hip;Fall Precaution Comments: Reviewed post hip precautions with pt and family. Pt able to recall 3/3 precautions with assist from family. Restrictions Weight Bearing Restrictions: Yes RLE Weight Bearing: Weight bearing as tolerated    Mobility  Bed Mobility Overal bed mobility: Needs Assistance Bed Mobility: Supine to Sit     Supine to sit: Min assist     General bed mobility comments: Min A for RLE to EOB.  Transfers Overall transfer level: Needs assistance Equipment used: Rolling walker (2 wheeled) Transfers: Sit to/from Stand Sit to Stand: Min guard         General transfer comment: Min guard for safety. Cues for hand placement.  Ambulation/Gait Ambulation/Gait assistance: Min guard Ambulation Distance (Feet): 240 Feet Assistive device: Rolling walker (2 wheeled) Gait Pattern/deviations: Step-through pattern;Decreased stance time - right;Decreased stride length   Gait velocity interpretation: Below normal  speed for age/gender General Gait Details: Cues to bend knees and keep RW moving.   Stairs Stairs: Yes Stairs assistance: Min guard Stair Management: Two rails;Step to pattern;Forwards Number of Stairs: 10 General stair comments: Pt able to go up/down stairs with safe technique and proper sequencing. Family present, and both pt and family feel confident with pt going up/down flight of steps at home.  Wheelchair Mobility    Modified Rankin (Stroke Patients Only)       Balance                                    Cognition Arousal/Alertness: Awake/alert Behavior During Therapy: WFL for tasks assessed/performed Overall Cognitive Status: Within Functional Limits for tasks assessed                      Exercises Total Joint Exercises Quad Sets: AROM;Right;10 reps Gluteal Sets: AROM;Both;10 reps Heel Slides: AAROM;Right;10 reps Hip ABduction/ADduction: AAROM;Right;10 reps Long Arc Quad: AROM;Right;15 reps    General Comments        Pertinent Vitals/Pain Pain Assessment: No/denies pain Pain Intervention(s): Monitored during session    Home Living                      Prior Function            PT Goals (current goals can now be found in the care plan section) Progress towards PT goals: Progressing toward goals    Frequency  7X/week    PT Plan Current plan remains appropriate    Co-evaluation  End of Session Equipment Utilized During Treatment: Gait belt Activity Tolerance: Patient tolerated treatment well Patient left: in chair;with call bell/phone within reach;with family/visitor present     Time: [id number] PT Time Calculation (min) (ACUTE ONLY): 41 min  Charges:                       G CodesLeonard Schwartz:      Eduin Friedel, SPTA 07/24/2014, 11:21 AM

## 2014-07-24 NOTE — Progress Notes (Signed)
Betty Mejia discharged home per MD order. Discharge instructions reviewed and discussed with patient. All questions and concerns answered. Copy of instructions and scripts given to patient. IV removed.  Patient escorted to car by staff in a wheelchair. No distress noted upon discharge.   Betty Mejia, Betty Mejia R 07/24/2014 11:27 AM

## 2014-11-18 ENCOUNTER — Other Ambulatory Visit: Payer: Self-pay | Admitting: Orthopedic Surgery

## 2014-11-20 ENCOUNTER — Encounter (HOSPITAL_COMMUNITY): Payer: Self-pay | Admitting: *Deleted

## 2014-11-20 MED ORDER — POVIDONE-IODINE 7.5 % EX SOLN
CUTANEOUS | Status: AC
  Filled 2014-11-20: qty 118

## 2014-11-20 MED ORDER — CEFAZOLIN SODIUM-DEXTROSE 2-3 GM-% IV SOLR
2.0000 g | INTRAVENOUS | Status: AC
  Administered 2014-11-21: 2 g via INTRAVENOUS

## 2014-11-20 NOTE — Progress Notes (Signed)
Pt denies any cardiac history, chest pain or sob. 

## 2014-11-20 NOTE — H&P (Signed)
PREOPERATIVE H&P  Chief Complaint: Low back and bilateral leg pain  HPI: Betty Mejia is a 54 y.o. female who presents with ongoing pain in the back and bilateral legs  Patient had a spinal cord stimulator placed years ago, and states that it does significantly help her pain. The patient's battery is due to become inoperable and needs to be replaced.   Patient has failed multiple forms of conservative care and continues to have pain (see office notes for additional details regarding the patient's full course of treatment)  Past Medical History  Diagnosis Date  . Trigeminal neuralgia   . Thyroid disease   . Back pain   . Arthritis   . Hypothyroidism   . Anxiety    Past Surgical History  Procedure Laterality Date  . Vaginal hysterectomy    . Back surgery      stimulator in back  x4  . Total hip arthroplasty Right 07/22/2014    Procedure: TOTAL HIP ARTHROPLASTY;  Surgeon: Gean Birchwood, MD;  Location: [facility] OR;  Service: Orthopedics;  Laterality: Right;   Social History   Social History  . Marital Status: Married    Spouse Name: Iantha Fallen  . Number of Children: 3  . Years of Education: 12   Occupational History  .      disabled   Social History Main Topics  . Smoking status: Never Smoker   . Smokeless tobacco: Never Used  . Alcohol Use: No  . Drug Use: No  . Sexual Activity: Not on file   Other Topics Concern  . Not on file   Social History Narrative   Patient is married with 3 children   Patient is right handed   Patient has high school education   Patient denies daily caffeine   Family History  Problem Relation Age of Onset  . Hypertension Mother   . Cancer Father   . Cancer Maternal Grandmother     uterus   No Known Allergies Prior to Admission medications   Medication Sig Start Date End Date Taking? Authorizing Provider  aspirin EC 325 MG tablet Take 1 tablet (325 mg total) by mouth 2 (two) times daily. 07/22/14   Allena Katz, PA-C    carbamazepine (TEGRETOL) 200 MG tablet Take 200-300 mg by mouth 2 (two) times daily.    Historical Provider, MD  Cholecalciferol (VITAMIN D PO) Take 1 tablet by mouth daily.    Historical Provider, MD  levothyroxine (SYNTHROID, LEVOTHROID) 75 MCG tablet Take 75 mcg by mouth daily before breakfast.    Historical Provider, MD  methocarbamol (ROBAXIN) 500 MG tablet Take 1 tablet (500 mg total) by mouth 2 (two) times daily with a meal. 07/22/14   Allena Katz, PA-C  oxyCODONE-acetaminophen (ROXICET) 5-325 MG per tablet Take 1 tablet by mouth every 4 (four) hours as needed. 07/22/14   Allena Katz, PA-C  pregabalin (LYRICA) 75 MG capsule Take 75 mg by mouth 2 (two) times daily.    Historical Provider, MD     All other systems have been reviewed and were otherwise negative with the exception of those mentioned in the HPI and as above.  Physical Exam: There were no vitals filed for this visit.  General: Alert, no acute distress Cardiovascular: No pedal edema Respiratory: No cyanosis, no use of accessory musculature + incision over upper left buttock region Neurologic: Sensation intact distally Psychiatric: Patient is competent for consent with normal mood and affect Lymphatic: No axillary or cervical lymphadenopathy  Assessment/Plan: Low back and bilateral leg pain Plan for Procedure(s): LUMBAR SPINAL CORD STIMULATOR BATTERY REPLACEMENT   Emilee Hero, MD 11/20/2014 10:52 AM

## 2014-11-21 ENCOUNTER — Ambulatory Visit (HOSPITAL_COMMUNITY): Payer: BLUE CROSS/BLUE SHIELD | Admitting: Anesthesiology

## 2014-11-21 ENCOUNTER — Encounter (HOSPITAL_COMMUNITY): Payer: Self-pay | Admitting: *Deleted

## 2014-11-21 ENCOUNTER — Ambulatory Visit: Payer: BLUE CROSS/BLUE SHIELD | Admitting: Nurse Practitioner

## 2014-11-21 ENCOUNTER — Ambulatory Visit (HOSPITAL_COMMUNITY)
Admission: RE | Admit: 2014-11-21 | Discharge: 2014-11-21 | Disposition: A | Discharge status: Home / Self Care | Payer: BLUE CROSS/BLUE SHIELD | Source: Ambulatory Visit | Attending: Orthopedic Surgery | Admitting: Orthopedic Surgery

## 2014-11-21 ENCOUNTER — Encounter (HOSPITAL_COMMUNITY)
Admission: RE | Disposition: A | Discharge status: Home / Self Care | Payer: Self-pay | Source: Ambulatory Visit | Attending: Orthopedic Surgery

## 2014-11-21 DIAGNOSIS — F419 Anxiety disorder, unspecified: Secondary | ICD-10-CM | POA: Diagnosis not present

## 2014-11-21 DIAGNOSIS — Z96641 Presence of right artificial hip joint: Secondary | ICD-10-CM | POA: Diagnosis not present

## 2014-11-21 DIAGNOSIS — Z4542 Encounter for adjustment and management of neuropacemaker (brain) (peripheral nerve) (spinal cord): Secondary | ICD-10-CM | POA: Insufficient documentation

## 2014-11-21 DIAGNOSIS — Z7982 Long term (current) use of aspirin: Secondary | ICD-10-CM | POA: Insufficient documentation

## 2014-11-21 DIAGNOSIS — Z79891 Long term (current) use of opiate analgesic: Secondary | ICD-10-CM | POA: Insufficient documentation

## 2014-11-21 DIAGNOSIS — Z79899 Other long term (current) drug therapy: Secondary | ICD-10-CM | POA: Diagnosis not present

## 2014-11-21 DIAGNOSIS — M545 Low back pain: Secondary | ICD-10-CM | POA: Insufficient documentation

## 2014-11-21 DIAGNOSIS — E039 Hypothyroidism, unspecified: Secondary | ICD-10-CM | POA: Insufficient documentation

## 2014-11-21 DIAGNOSIS — M79604 Pain in right leg: Secondary | ICD-10-CM | POA: Diagnosis not present

## 2014-11-21 DIAGNOSIS — M79605 Pain in left leg: Secondary | ICD-10-CM | POA: Insufficient documentation

## 2014-11-21 DIAGNOSIS — R001 Bradycardia, unspecified: Secondary | ICD-10-CM | POA: Insufficient documentation

## 2014-11-21 HISTORY — DX: Cardiac murmur, unspecified: R01.1

## 2014-11-21 HISTORY — PX: SPINAL CORD STIMULATOR REMOVAL: SHX5379

## 2014-11-21 HISTORY — DX: Restless legs syndrome: G25.81

## 2014-11-21 LAB — CBC WITH DIFFERENTIAL/PLATELET
Basophils Absolute: 0.1 10*3/uL (ref 0.0–0.1)
Basophils Relative: 1 % (ref 0–1)
EOS PCT: 2 % (ref 0–5)
Eosinophils Absolute: 0.1 10*3/uL (ref 0.0–0.7)
HEMATOCRIT: 38.9 % (ref 36.0–46.0)
HEMOGLOBIN: 12.7 g/dL (ref 12.0–15.0)
LYMPHS ABS: 3.6 10*3/uL (ref 0.7–4.0)
LYMPHS PCT: 53 % — AB (ref 12–46)
MCH: 31.8 pg (ref 26.0–34.0)
MCHC: 32.6 g/dL (ref 30.0–36.0)
MCV: 97.3 fL (ref 78.0–100.0)
Monocytes Absolute: 0.4 10*3/uL (ref 0.1–1.0)
Monocytes Relative: 5 % (ref 3–12)
NEUTROS ABS: 2.7 10*3/uL (ref 1.7–7.7)
Neutrophils Relative %: 39 % — ABNORMAL LOW (ref 43–77)
PLATELETS: 264 10*3/uL (ref 150–400)
RBC: 4 MIL/uL (ref 3.87–5.11)
RDW: 14.3 % (ref 11.5–15.5)
WBC: 6.8 10*3/uL (ref 4.0–10.5)

## 2014-11-21 LAB — COMPREHENSIVE METABOLIC PANEL
ALK PHOS: 77 U/L (ref 38–126)
ALT: 15 U/L (ref 14–54)
AST: 18 U/L (ref 15–41)
Albumin: 3.6 g/dL (ref 3.5–5.0)
Anion gap: 7 (ref 5–15)
BILIRUBIN TOTAL: 0.3 mg/dL (ref 0.3–1.2)
BUN: 13 mg/dL (ref 6–20)
CALCIUM: 8.9 mg/dL (ref 8.9–10.3)
CO2: 26 mmol/L (ref 22–32)
CREATININE: 0.91 mg/dL (ref 0.44–1.00)
Chloride: 110 mmol/L (ref 101–111)
Glucose, Bld: 89 mg/dL (ref 65–99)
Potassium: 4.4 mmol/L (ref 3.5–5.1)
Sodium: 143 mmol/L (ref 135–145)
TOTAL PROTEIN: 6.9 g/dL (ref 6.5–8.1)

## 2014-11-21 LAB — APTT: aPTT: 31 seconds (ref 24–37)

## 2014-11-21 LAB — SURGICAL PCR SCREEN
MRSA, PCR: NEGATIVE
Staphylococcus aureus: NEGATIVE

## 2014-11-21 LAB — PROTIME-INR
INR: 1.13 (ref 0.00–1.49)
PROTHROMBIN TIME: 14.7 s (ref 11.6–15.2)

## 2014-11-21 SURGERY — LUMBAR SPINAL CORD STIMULATOR REMOVAL
Anesthesia: General | Site: Flank | Laterality: Right

## 2014-11-21 MED ORDER — OXYCODONE HCL 5 MG PO TABS: 5.0000 mg | ORAL_TABLET | Freq: Once | ORAL | Status: DC | PRN

## 2014-11-21 MED ORDER — OXYCODONE HCL 5 MG/5ML PO SOLN: 5.0000 mg | Freq: Once | ORAL | Status: DC | PRN

## 2014-11-21 MED ORDER — EPHEDRINE SULFATE 50 MG/ML IJ SOLN
INTRAMUSCULAR | Status: DC | PRN
  Administered 2014-11-21: 25 mg via INTRAVENOUS

## 2014-11-21 MED ORDER — MIDAZOLAM HCL 5 MG/5ML IJ SOLN
INTRAMUSCULAR | Status: DC | PRN
  Administered 2014-11-21 (×2): 1 mg via INTRAVENOUS

## 2014-11-21 MED ORDER — ONDANSETRON HCL 4 MG/2ML IJ SOLN: 4.0000 mg | Freq: Once | INTRAMUSCULAR | Status: DC | PRN

## 2014-11-21 MED ORDER — ONDANSETRON HCL 4 MG/2ML IJ SOLN
INTRAMUSCULAR | Status: DC | PRN
  Administered 2014-11-21: 4 mg via INTRAVENOUS

## 2014-11-21 MED ORDER — MIDAZOLAM HCL 2 MG/2ML IJ SOLN
INTRAMUSCULAR | Status: AC
  Filled 2014-11-21: qty 4

## 2014-11-21 MED ORDER — FENTANYL CITRATE (PF) 100 MCG/2ML IJ SOLN
INTRAMUSCULAR | Status: DC | PRN
  Administered 2014-11-21 (×2): 50 ug via INTRAVENOUS

## 2014-11-21 MED ORDER — NEOSTIGMINE METHYLSULFATE 10 MG/10ML IV SOLN
INTRAVENOUS | Status: DC | PRN
  Administered 2014-11-21: 3 mg via INTRAVENOUS

## 2014-11-21 MED ORDER — PROPOFOL 10 MG/ML IV BOLUS
INTRAVENOUS | Status: AC
  Filled 2014-11-21: qty 20

## 2014-11-21 MED ORDER — FENTANYL CITRATE (PF) 250 MCG/5ML IJ SOLN
INTRAMUSCULAR | Status: AC
  Filled 2014-11-21: qty 5

## 2014-11-21 MED ORDER — BUPIVACAINE-EPINEPHRINE (PF) 0.25% -1:200000 IJ SOLN
INTRAMUSCULAR | Status: AC
  Filled 2014-11-21: qty 30

## 2014-11-21 MED ORDER — LACTATED RINGERS IV SOLN
INTRAVENOUS | Status: DC | PRN
  Administered 2014-11-21 (×2): via INTRAVENOUS

## 2014-11-21 MED ORDER — LACTATED RINGERS IV SOLN
INTRAVENOUS | Status: DC
  Administered 2014-11-21: 10:00:00 via INTRAVENOUS

## 2014-11-21 MED ORDER — MUPIROCIN 2 % EX OINT
TOPICAL_OINTMENT | CUTANEOUS | Status: AC
  Administered 2014-11-21: 1 via TOPICAL
  Filled 2014-11-21: qty 22

## 2014-11-21 MED ORDER — BUPIVACAINE-EPINEPHRINE 0.25% -1:200000 IJ SOLN
INTRAMUSCULAR | Status: DC | PRN
  Administered 2014-11-21: 2 mL

## 2014-11-21 MED ORDER — GLYCOPYRROLATE 0.2 MG/ML IJ SOLN
INTRAMUSCULAR | Status: DC | PRN
  Administered 2014-11-21: .2 mg via INTRAVENOUS
  Administered 2014-11-21: 0.2 mg via INTRAVENOUS
  Administered 2014-11-21: .4 mg via INTRAVENOUS
  Administered 2014-11-21: 0.2 mg via INTRAVENOUS

## 2014-11-21 MED ORDER — 0.9 % SODIUM CHLORIDE (POUR BTL) OPTIME
TOPICAL | Status: DC | PRN
  Administered 2014-11-21: 1000 mL

## 2014-11-21 MED ORDER — HYDROMORPHONE HCL 1 MG/ML IJ SOLN: 0.2500 mg | INTRAMUSCULAR | Status: DC | PRN

## 2014-11-21 MED ORDER — LIDOCAINE HCL (CARDIAC) 20 MG/ML IV SOLN
INTRAVENOUS | Status: DC | PRN
  Administered 2014-11-21: 140 mg via INTRAVENOUS

## 2014-11-21 MED ORDER — MUPIROCIN 2 % EX OINT
1.0000 "application " | TOPICAL_OINTMENT | Freq: Once | CUTANEOUS | Status: AC
  Administered 2014-11-21: 1 via TOPICAL

## 2014-11-21 MED ORDER — ROCURONIUM BROMIDE 100 MG/10ML IV SOLN
INTRAVENOUS | Status: DC | PRN
  Administered 2014-11-21: 30 mg via INTRAVENOUS

## 2014-11-21 MED ORDER — PROPOFOL 10 MG/ML IV BOLUS
INTRAVENOUS | Status: DC | PRN
  Administered 2014-11-21: 130 mg via INTRAVENOUS

## 2014-11-21 MED ORDER — CEFAZOLIN SODIUM-DEXTROSE 2-3 GM-% IV SOLR
INTRAVENOUS | Status: AC
  Filled 2014-11-21: qty 50

## 2014-11-21 MED ORDER — PHENYLEPHRINE HCL 10 MG/ML IJ SOLN
INTRAMUSCULAR | Status: DC | PRN
  Administered 2014-11-21 (×2): 40 ug via INTRAVENOUS
  Administered 2014-11-21 (×4): 80 ug via INTRAVENOUS
  Administered 2014-11-21: 40 ug via INTRAVENOUS
  Administered 2014-11-21: 80 ug via INTRAVENOUS

## 2014-11-21 SURGICAL SUPPLY — 57 items
BAG ISOLATION DRAPE 18X18 (DRAPES) ×1 IMPLANT
BENZOIN TINCTURE PRP APPL 2/3 (GAUZE/BANDAGES/DRESSINGS) ×3 IMPLANT
BUR MATCHSTICK NEURO 3.0 LAGG (BURR) IMPLANT
CANISTER SUCTION 2500CC (MISCELLANEOUS) ×3 IMPLANT
CLOSURE STERI-STRIP 1/2X4 (GAUZE/BANDAGES/DRESSINGS) ×1
CLOSURE WOUND 1/2 X4 (GAUZE/BANDAGES/DRESSINGS) ×1
CLSR STERI-STRIP ANTIMIC 1/2X4 (GAUZE/BANDAGES/DRESSINGS) ×2 IMPLANT
CORDS BIPOLAR (ELECTRODE) ×3 IMPLANT
DRAPE C-ARM 42X72 X-RAY (DRAPES) ×3 IMPLANT
DRAPE ISOLATION BAG 18X18 (DRAPES) ×2
DRAPE POUCH INSTRU U-SHP 10X18 (DRAPES) ×3 IMPLANT
DRAPE SURG 17X23 STRL (DRAPES) ×9 IMPLANT
DURAPREP 26ML APPLICATOR (WOUND CARE) ×3 IMPLANT
ELECT CAUTERY BLADE 6.4 (BLADE) ×3 IMPLANT
ELECT REM PT RETURN 9FT ADLT (ELECTROSURGICAL) ×3
ELECTRODE REM PT RTRN 9FT ADLT (ELECTROSURGICAL) ×1 IMPLANT
GAUZE SPONGE 4X4 12PLY STRL (GAUZE/BANDAGES/DRESSINGS) ×3 IMPLANT
GAUZE SPONGE 4X4 16PLY XRAY LF (GAUZE/BANDAGES/DRESSINGS) ×3 IMPLANT
GLOVE BIO SURGEON STRL SZ8 (GLOVE) ×3 IMPLANT
GLOVE BIOGEL PI IND STRL 8 (GLOVE) ×1 IMPLANT
GLOVE BIOGEL PI INDICATOR 8 (GLOVE) ×2
GOWN STRL REUS W/ TWL LRG LVL3 (GOWN DISPOSABLE) ×2 IMPLANT
GOWN STRL REUS W/ TWL XL LVL3 (GOWN DISPOSABLE) ×1 IMPLANT
GOWN STRL REUS W/TWL LRG LVL3 (GOWN DISPOSABLE) ×4
GOWN STRL REUS W/TWL XL LVL3 (GOWN DISPOSABLE) ×2
KIT BASIN OR (CUSTOM PROCEDURE TRAY) ×3 IMPLANT
KIT ROOM TURNOVER OR (KITS) ×3 IMPLANT
NDL SUT 6 .5 CRC .975X.05 MAYO (NEEDLE) ×1 IMPLANT
NEEDLE HYPO 25GX1X1/2 BEV (NEEDLE) ×3 IMPLANT
NEEDLE MAYO TAPER (NEEDLE) ×2
NS IRRIG 1000ML POUR BTL (IV SOLUTION) ×3 IMPLANT
PACK LAMINECTOMY ORTHO (CUSTOM PROCEDURE TRAY) ×3 IMPLANT
PACK UNIVERSAL I (CUSTOM PROCEDURE TRAY) ×3 IMPLANT
PAD ARMBOARD 7.5X6 YLW CONV (MISCELLANEOUS) ×6 IMPLANT
PROGRAMMER PATIENT (MISCELLANEOUS) ×3 IMPLANT
SPONGE GAUZE 4X4 12PLY STER LF (GAUZE/BANDAGES/DRESSINGS) ×3 IMPLANT
SPONGE LAP 4X18 X RAY DECT (DISPOSABLE) ×3 IMPLANT
STIMULATOR SURESCAN SPINAL (Neurostimulator) ×3 IMPLANT
STRIP CLOSURE SKIN 1/2X4 (GAUZE/BANDAGES/DRESSINGS) ×2 IMPLANT
SUT FIBERWIRE 2-0 18 17.9 3/8 (SUTURE) ×3
SUT MNCRL AB 3-0 PS2 18 (SUTURE) ×6 IMPLANT
SUT PDS AB 2-0 CT1 27 (SUTURE) ×3 IMPLANT
SUT VIC AB 0 CT1 18XCR BRD 8 (SUTURE) ×1 IMPLANT
SUT VIC AB 0 CT1 8-18 (SUTURE) ×2
SUT VIC AB 1 CT1 18XCR BRD 8 (SUTURE) ×1 IMPLANT
SUT VIC AB 1 CT1 8-18 (SUTURE) ×2
SUT VIC AB 2-0 CT1 18 (SUTURE) ×3 IMPLANT
SUT VIC AB 2-0 CT2 18 VCP726D (SUTURE) ×3 IMPLANT
SUTURE FIBERWR 2-0 18 17.9 3/8 (SUTURE) ×1 IMPLANT
SYR BULB IRRIGATION 50ML (SYRINGE) ×3 IMPLANT
SYR CONTROL 10ML LL (SYRINGE) ×3 IMPLANT
SYSTEM RECHARG SURESCAN SPINE (Neurostimulator) ×3 IMPLANT
TAPE CLOTH SURG 4X10 WHT LF (GAUZE/BANDAGES/DRESSINGS) ×3 IMPLANT
TOWEL OR 17X24 6PK STRL BLUE (TOWEL DISPOSABLE) ×3 IMPLANT
TOWEL OR 17X26 10 PK STRL BLUE (TOWEL DISPOSABLE) ×3 IMPLANT
TRAY FOLEY CATH 16FRSI W/METER (SET/KITS/TRAYS/PACK) IMPLANT
WATER STERILE IRR 1000ML POUR (IV SOLUTION) ×3 IMPLANT

## 2014-11-21 NOTE — Anesthesia Procedure Notes (Signed)
Procedure Name: Intubation Date/Time: 11/21/2014 11:43 AM Performed by: Minerva Ends Pre-anesthesia Checklist: Patient identified, Timeout performed, Emergency Drugs available, Suction available and Patient being monitored Patient Re-evaluated:Patient Re-evaluated prior to inductionOxygen Delivery Method: Circle system utilized Preoxygenation: Pre-oxygenation with 100% oxygen Intubation Type: IV induction Ventilation: Mask ventilation without difficulty Laryngoscope Size: Miller and 2 Grade View: Grade I Tube type: Oral Tube size: 7.0 mm Number of attempts: 1 Airway Equipment and Method: Stylet Placement Confirmation: positive ETCO2,  ETT inserted through vocal cords under direct vision and breath sounds checked- equal and bilateral Secured at: 22 cm Tube secured with: Tape Dental Injury: Teeth and Oropharynx as per pre-operative assessment  Comments: IV induction Betty Mejia- intubation AM CRNA - atraumatic- chipped front teeth present prior to laryngoscopy- bilat BS Betty Mejia

## 2014-11-21 NOTE — Transfer of Care (Signed)
Immediate Anesthesia Transfer of Care Note  Patient: Betty Mejia  Procedure(s) Performed: Procedure(s) with comments: LUMBAR SPINAL CORD STIMULATOR BATTERY REPLACEMENT (Right) - Lumbar spinal cord stimulator battery replacement  Patient Location: PACU  Anesthesia Type:General  Level of Consciousness: awake, alert  and oriented  Airway & Oxygen Therapy: Patient Spontanous Breathing and Patient connected to nasal cannula oxygen  Post-op Assessment: Report given to RN and Post -op Vital signs reviewed and stable  Post vital signs: Reviewed and stable  Last Vitals:  Filed Vitals:   11/21/14 0804  BP: 118/74  Pulse: 57  Temp: 36.5 C  Resp: 18    Complications: No apparent anesthesia complications

## 2014-11-21 NOTE — Anesthesia Preprocedure Evaluation (Addendum)

## 2014-11-22 ENCOUNTER — Encounter (HOSPITAL_COMMUNITY): Payer: Self-pay | Admitting: Orthopedic Surgery

## 2014-11-22 NOTE — Anesthesia Postprocedure Evaluation (Signed)
  Anesthesia Post-op Note  Patient: Betty Mejia  Procedure(s) Performed: Procedure(s) with comments: LUMBAR SPINAL CORD STIMULATOR BATTERY REPLACEMENT (Right) - Lumbar spinal cord stimulator battery replacement  Patient Location: PACU  Anesthesia Type:General  Level of Consciousness: awake, alert  and oriented  Airway and Oxygen Therapy: Patient Spontanous Breathing and Patient connected to nasal cannula oxygen  Post-op Pain: mild  Post-op Assessment: Post-op Vital signs reviewed, Patient's Cardiovascular Status Stable, Respiratory Function Stable, Patent Airway and Pain level controlled              Post-op Vital Signs: stable  Last Vitals:  Filed Vitals:   11/21/14 1400  BP: 130/69  Pulse: 58  Temp: 36.7 C  Resp:     Complications: No apparent anesthesia complications

## 2014-11-22 NOTE — Op Note (Signed)
NAME:  Betty Mejia, GALER NO.:  [id number]  MEDICAL RECORD NO.:  [id number]  LOCATION:  [facility]                         FACILITY:  [facility]  PHYSICIAN:  Estill Bamberg, MD      DATE OF BIRTH:  1960/07/19  DATE OF PROCEDURE:  11/21/2014 DATE OF DISCHARGE:  11/21/2014                              OPERATIVE REPORT   PREOPERATIVE DIAGNOSIS:  Status post spinal cord stimulator implant with a failing battery, requiring replacement.  POSTOPERATIVE DIAGNOSIS:  Status post spinal cord stimulator implant with a failing battery, requiring replacement.  PROCEDURE:  Replacement of spinal cord stimulator battery.  SURGEON:  Estill Bamberg, M.D.  ASSISTANT:  Jason Coop, PA-C.  ANESTHESIA:  General endotracheal anesthesia.  COMPLICATIONS:  None.  DISPOSITION:  Stable.  ESTIMATED BLOOD LOSS:  Minimal.  INDICATIONS FOR SURGERY:  Briefly, [name] is a pleasant 54 year old female who was referred to me for replacement of a spinal cord stimulator battery.  She did have back and leg pain, and the patient did state that the spinal cord stimulator was substantially alleviating her pain.  The stimulator was placed approximately 8 years ago, and was requiring a new battery.  The patient did present to surgery on November 21, 2014, for replacement of the battery.  OPERATIVE DETAILS:  On November 21, 2014, the patient was brought to surgery and general endotracheal anesthesia was administered.  The patient was placed prone on a well-padded flat Jackson bed with a spinal frame.  Antibiotics were given.  The region of the left buttock was prepped and draped in the usual sterile fashion.  Time-out procedure was performed.  I then reutilized the patient's previous left transverse incision, at approximately the region of her belt line.  Dissection was carried to the subcutaneous tissue.  The previously placed spinal cord stimulator battery was encountered.  Using a wrench, the leads  were removed.  The new battery was put into place and the leads were placed into the new battery.  The battery was tested for appropriate functioning by the [employer] technician.  I was told that it was functioning appropriately.  The better was then secured to the fascia using a 2-0 PDS stitch.  The wound was then closed in layers using 0 Vicryl followed by 2-0 Vicryl, followed by 3-0 Monocryl.  Benzoin and Steri-Strips were applied followed by sterile dressing.  All instrument counts were correct at the termination of the procedure.  Of note, it was noted by the Anesthesia team that the patient did have intermittent bradycardia, however, it was also reported that her blood pressure was stable throughout the surgery.  At the time of wound closure, her heart rate was in the 90s and her blood pressure was stable.  This will continued to be monitored in the recovery room, an additional surveillance or treatment for any abnormality in her vital signs will be directed by the Anesthesia team.  Of note, Jason Coop was my assistant throughout the surgery.     Estill Bamberg, MD     MD/MEDQ  D:  11/21/2014  T:  11/22/2014  Job:  [id number]

## 2014-11-27 ENCOUNTER — Encounter: Payer: Self-pay | Admitting: Nurse Practitioner

## 2014-11-27 ENCOUNTER — Ambulatory Visit (INDEPENDENT_AMBULATORY_CARE_PROVIDER_SITE_OTHER): Payer: BLUE CROSS/BLUE SHIELD | Admitting: Nurse Practitioner

## 2014-11-27 VITALS — BP 109/70 | HR 65 | Ht 65.0 in | Wt 191.2 lb

## 2014-11-27 DIAGNOSIS — R200 Anesthesia of skin: Secondary | ICD-10-CM | POA: Diagnosis not present

## 2014-11-27 DIAGNOSIS — Z5181 Encounter for therapeutic drug level monitoring: Secondary | ICD-10-CM | POA: Diagnosis not present

## 2014-11-27 DIAGNOSIS — R202 Paresthesia of skin: Secondary | ICD-10-CM | POA: Diagnosis not present

## 2014-11-27 DIAGNOSIS — G500 Trigeminal neuralgia: Secondary | ICD-10-CM

## 2014-11-27 MED ORDER — CARBAMAZEPINE 200 MG PO TABS: 200.0000 mg | ORAL_TABLET | Freq: Three times a day (TID) | ORAL | Status: DC

## 2014-11-27 NOTE — Patient Instructions (Signed)
Will check CBZ level Nerve conduction study to be done Continue Tegretol will reorder F/U with Dr. Lucia Gaskins

## 2014-11-27 NOTE — Progress Notes (Addendum)
[facility]  PATIENT: Betty Mejia DOB: 1960/10/02   REASON FOR VISIT: Follow-up for trigeminal neuralgia, new complaint of left hand and arm numbness HISTORY FROM: Patient    HISTORY OF PRESENT ILLNESS:Betty Mejia, 54 year old female returns for followup. She was last seen in the office 05/24/14. She has a history of trigeminal neuralgia has been well-controlled on 200 mg three times  daily of Tegretol. Her pain is typically in the left facial area including the eye.  Her symptoms may be triggered by ice or cold food talking on the phone and chewing she has not had any recent flares . She denies side effects to the Tegretol, no dizziness no balance issues. She also sees pain management for chronic back pain. She had right hip surgery  in April. New complaint today of left hand and forearm numbness and occasional pain discomfort. She frequently drops things. She returns for reevaluation  HISTORY: Betty Mejia is a 54 y.o. female here as a referral from Dr. Julio Sicks for trigeminal neuralgia  Symptoms started around 15 years ago, is on the left side. Currently taking tegretol  twice a day for this condition. Has been on a stable dose of tegretol for years. Notes her symptoms can be triggered by the wind, chewing, hot or cold food. Can tell symptoms are coming on due to twitching of her left eye. If she feels it coming on will take an extra tegretol and that usually calms her symptoms down. Last flare up was this past winter when she was up in [place], symptoms were resolved by dissolving a tegretol. Tolerating the tegretol well, no dizziness, mild fatigue.  Has a spinal stimulator for chronic pain, has given good benefit. 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 Is currently working with a pain management doctor for further management.  Prior neurologist was Dr Zenia Resides in [place], number is [phone]. Recently moved to this area.     REVIEW OF SYSTEMS:  Full 14 system review of systems performed and notable only for those listed, all others are neg:  Constitutional: neg  Cardiovascular: neg Ear/Nose/Throat: neg  Skin: neg Eyes: Blurred vision  Respiratory: neg Gastroitestinal: neg  Hematology/Lymphatic: neg  Endocrine: neg Musculoskeletal: Joint pain back pain neck pain Allergy/Immunology: neg Neurological: Numbness in the left hand and forearm Psychiatric: neg Sleep : Restless legs   ALLERGIES: No Known Allergies  HOME MEDICATIONS: Outpatient Prescriptions Prior to Visit  Medication Sig Dispense Refill  . carbamazepine (TEGRETOL) 200 MG tablet Take 200 mg by mouth 3 (three) times daily.     . Cholecalciferol (VITAMIN D PO) Take 1 tablet by mouth daily.    Marland Kitchen levothyroxine (SYNTHROID, LEVOTHROID) 75 MCG tablet Take 75 mcg by mouth daily before breakfast.    . nabumetone (RELAFEN) 500 MG tablet Take 500 mg by mouth 2 (two) times daily.    Marland Kitchen oxyCODONE-acetaminophen (ROXICET) 5-325 MG per tablet Take 1 tablet by mouth every 4 (four) hours as needed. 60 tablet 0  . pregabalin (LYRICA) 75 MG capsule Take 75 mg by mouth 2 (two) times daily.    Marland Kitchen rOPINIRole (REQUIP) 0.25 MG tablet Take 0.25 mg by mouth at bedtime as needed (restless leg syndrome). Daily at bedtime as needed     No facility-administered medications prior to visit.    PAST MEDICAL HISTORY: Past Medical History  Diagnosis Date  . Trigeminal neuralgia   . Thyroid disease   . Back pain   . Arthritis   . Hypothyroidism   .  Heart murmur     as a baby  . Restless leg syndrome   . Anxiety     pt denies    PAST SURGICAL HISTORY: Past Surgical History  Procedure Laterality Date  . Vaginal hysterectomy    . Back surgery      stimulator in back  x4  . Total hip arthroplasty Right 07/22/2014    Procedure: TOTAL HIP ARTHROPLASTY;  Surgeon: Gean Birchwood, MD;  Location: [facility];  Service: Orthopedics;  Laterality: Right;  . Colonoscopy    . Spinal cord stimulator  removal Right 11/21/2014    Procedure: LUMBAR SPINAL CORD STIMULATOR BATTERY REPLACEMENT;  Surgeon: Estill Bamberg, MD;  Location: [facility];  Service: Orthopedics;  Laterality: Right;  Lumbar spinal cord stimulator battery replacement    FAMILY HISTORY: Family History  Problem Relation Age of Onset  . Hypertension Mother   . Cancer Father   . Cancer Maternal Grandmother     uterus    SOCIAL HISTORY: Social History   Social History  . Marital Status: Married    Spouse Name: Iantha Fallen  . Number of Children: 3  . Years of Education: 12   Occupational History  .      disabled   Social History Main Topics  . Smoking status: Never Smoker   . Smokeless tobacco: Never Used  . Alcohol Use: No  . Drug Use: No  . Sexual Activity: Not on file   Other Topics Concern  . Not on file   Social History Narrative   Patient is married with 3 children   Patient is right handed   Patient has high school education   Patient denies daily caffeine     PHYSICAL EXAM  Filed Vitals:   11/27/14 1016  BP: 109/70  Pulse: 65  Height: 5\' 5"  (1.651 m)  Weight: 191 lb 3.2 oz (86.728 kg)   Body mass index is 31.82 kg/(m^2). Generalized: Well developed, in no acute distress  Head: normocephalic and atraumatic,. Oropharynx benign  Neck: Supple, no carotid bruits  Cardiac: Regular rate rhythm, no murmur  Musculoskeletal: No deformity   Neurological examination   Mentation: Alert oriented to time, place, history taking. Follows all commands speech and language fluent  Cranial nerve II-XII: Visual acuity 20/20 right, 20/30 left. Pupils were equal round reactive to light extraocular movements were full, visual field were full on confrontational test. Facial sensation and strength were normal. hearing was intact to finger rubbing bilaterally. Uvula tongue midline. head turning and shoulder shrug were normal and symmetric.Tongue protrusion into cheek strength was normal. Motor: normal bulk and  tone, full strength in the BUE, BLE, . Fine finger movements normal, no pronator drift. Mild decreased left grip  Sensory: normal and symmetric to light touch, pinprick, and vibration positive tinels on the left Coordination: finger-nose-finger, heel-to-shin bilaterally, no dysmetria Reflexes: Brachioradialis 2/2, biceps 2/2, triceps 2/2, patellar 2/2, Achilles 2/2, plantar responses were flexor bilaterally. Gait and Station: Rising up from seated position without assistance, normal stance, moderate stride, good arm swing, smooth turning,  uses single-point cane    DIAGNOSTIC DATA (LABS, IMAGING, TESTING) - I reviewed patient records, labs, notes, testing and imaging myself where available.  Lab Results  Component Value Date   WBC 6.8 11/21/2014   HGB 12.7 11/21/2014   HCT 38.9 11/21/2014   MCV 97.3 11/21/2014   PLT 264 11/21/2014      Component Value Date/Time   NA 143 11/21/2014 0842   K 4.4 11/21/2014 0842  CL 110 11/21/2014 0842   CO2 26 11/21/2014 0842   GLUCOSE 89 11/21/2014 0842   BUN 13 11/21/2014 0842   CREATININE 0.91 11/21/2014 0842   CALCIUM 8.9 11/21/2014 0842   PROT 6.9 11/21/2014 0842   ALBUMIN 3.6 11/21/2014 0842   AST 18 11/21/2014 0842   ALT 15 11/21/2014 0842   ALKPHOS 77 11/21/2014 0842   BILITOT 0.3 11/21/2014 0842   GFRNONAA >60 11/21/2014 0842   GFRAA >60 11/21/2014 0842    ASSESSMENT AND PLAN  54 y.o. year old female  has a past medical history of Trigeminal neuralgia; Thyroid disease; Back pain; Arthritis; Hypothyroidism;  Restless leg syndrome; and Anxiety, and new complaint of numbness in the left hand and forearm  Will check CBZ level today and adjust if necessary. Reviewed recent CBC and CMP 8/25/ 2016 EMG /Nerve conduction study to be done to evaluate left hand and forearm numbness and pain Continue Tegretol will reorder F/U with Dr. Hassan Buckler Darrol Angel, [facility], [facility], APRN  [facility] [address], [address] [place], [place] [address] [phone]   Personally participated in and made any corrections needed to history, physical, neuro exam,assessment and plan as stated above.   Naomie Dean, MD Stroke Neurology [phone] [facility]

## 2014-11-28 ENCOUNTER — Telehealth: Payer: Self-pay | Admitting: *Deleted

## 2014-11-28 LAB — CARBAMAZEPINE LEVEL, TOTAL: CARBAMAZEPINE LVL: 3.3 ug/mL — AB (ref 4.0–12.0)

## 2014-11-28 NOTE — Telephone Encounter (Signed)
LMVM for pt on her home # that the lab results were ok.  She is to call back if questions.

## 2014-11-28 NOTE — Telephone Encounter (Signed)
-----   Message from Nilda Riggs, NP sent at 11/28/2014  7:37 AM EDT ----- Labs ok please call

## 2014-11-28 NOTE — Progress Notes (Signed)
Quick Note:  I called and LMVM for pt on home # that her labs were ok. She is to call back if needed. ______

## 2014-12-18 ENCOUNTER — Encounter: Payer: BLUE CROSS/BLUE SHIELD | Admitting: Neurology

## 2015-03-18 ENCOUNTER — Telehealth: Payer: Self-pay | Admitting: Nurse Practitioner

## 2015-03-18 NOTE — Telephone Encounter (Signed)
Patient called to advise she had a really bad episode with trigeminal neuralgia, whole left side going towards back of head, took extra Tegretol, just wanted to make Betty Mejia aware.

## 2015-03-18 NOTE — Telephone Encounter (Signed)
Called pt back. Advised Betty Mejia is off this week and pt is aware. She was told by Betty Jonesarolyn to call and let her know when she had to take an extra tegretol. Advised her I will give message to Scipioarolyn. She verbalized understanding and appreciation.

## 2015-03-25 NOTE — Telephone Encounter (Signed)
Telephone call to patient. She had taken a couple of extra doses of Tegretol last week to relieve her trigeminal neuralgia. It is now okay and she does not wish to increase to 4 tabs a day. She will continue 3 capsules daily.

## 2015-05-26 ENCOUNTER — Encounter: Payer: Self-pay | Admitting: Nurse Practitioner

## 2015-05-26 ENCOUNTER — Ambulatory Visit (INDEPENDENT_AMBULATORY_CARE_PROVIDER_SITE_OTHER): Payer: BLUE CROSS/BLUE SHIELD | Admitting: Nurse Practitioner

## 2015-05-26 VITALS — BP 118/78 | HR 66 | Ht 65.0 in | Wt 195.2 lb

## 2015-05-26 DIAGNOSIS — G500 Trigeminal neuralgia: Secondary | ICD-10-CM | POA: Diagnosis not present

## 2015-05-26 DIAGNOSIS — R202 Paresthesia of skin: Secondary | ICD-10-CM

## 2015-05-26 DIAGNOSIS — R200 Anesthesia of skin: Secondary | ICD-10-CM

## 2015-05-26 IMAGING — CR DG CHEST 2V
2 series · 2 of 2 positions shown · non-contrast
Comparison: Chest x-ray of 06/09/2013

CLINICAL DATA: For right total hip arthroplasty

EXAM:
CHEST  2 VIEW

[w chest pa]
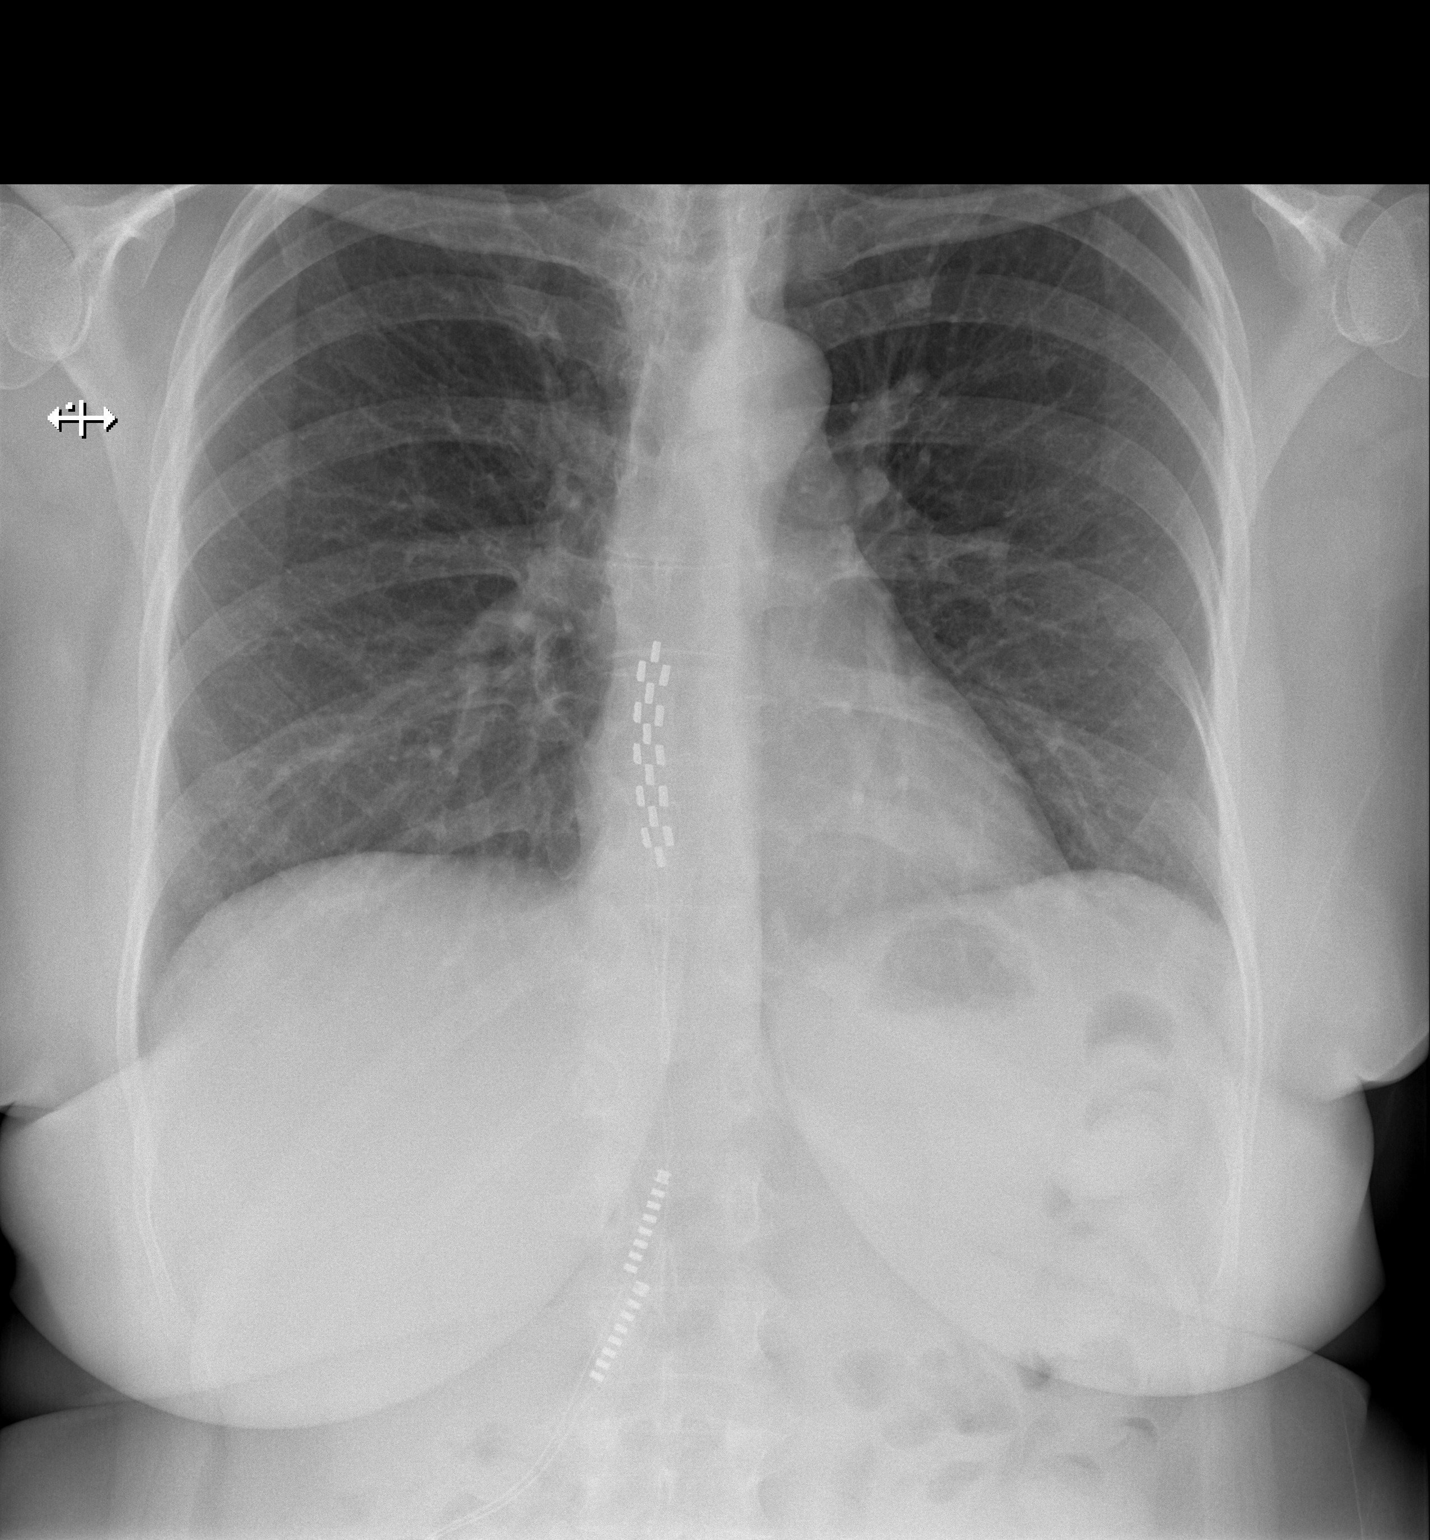

[w chest lat]
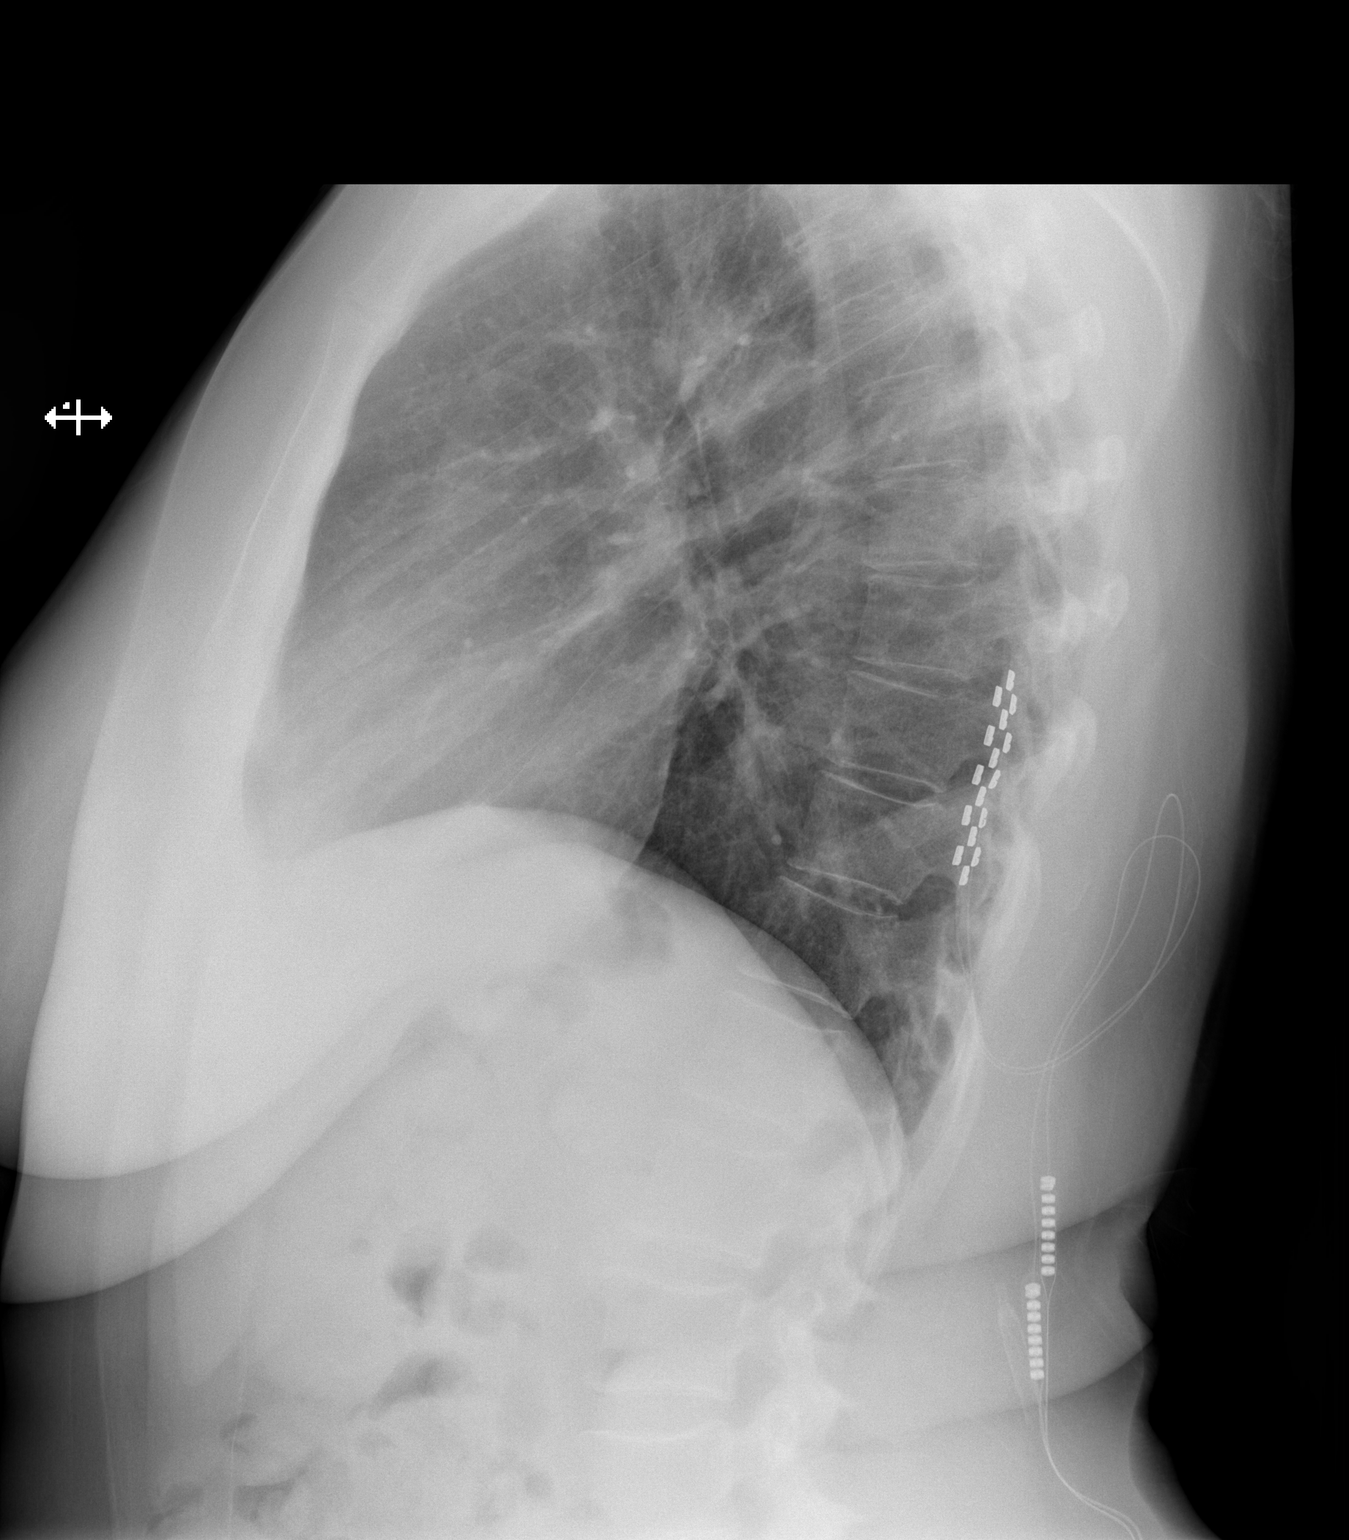

[2 of 2 positions shown; findings below may reference images not displayed]

FINDINGS: No active infiltrate or effusion is seen. Mediastinal and hilar
contours are unremarkable. The heart is within upper limits of
normal. Neurostimulator electrode leads overlie the lower thoracic
spinal canal.
IMPRESSION: No active cardiopulmonary disease. Neurostimulator leads as noted
above.

## 2015-05-26 NOTE — Patient Instructions (Signed)
Will check CBZ level today and adjust if necessary.  EMG /Nerve conduction study to be done to evaluate left hand and forearm numbness and pain Continue Tegretol will reorder F/U with Dr. Lucia Gaskins

## 2015-05-26 NOTE — Progress Notes (Addendum)
[facility]  PATIENT: Betty Mejia DOB: November 27, 1960   REASON FOR VISIT: Follow-up for trigeminal neuralgia, left hand and arm numbness HISTORY FROM: Patient    HISTORY OF PRESENT ILLNESS:Betty Mejia, 55 year old female returns for followup. She was last seen in the office 11/27/14.  She has a history of trigeminal neuralgia has been well-controlled on 200 mg three times daily of Tegretol. Her pain is typically in the left facial area including the eye.Her symptoms may be triggered by ice or cold food talking on the phone and chewing she has not had any recent flares .She did call me to increase her Tegretol around Christmas and she took an increased dose for about a week then went back to 3 tablets daily. She denies side effects to the Tegretol, no dizziness no balance issues. She also sees pain management for chronic back pain. She had right hip surgery in April 2016 and now needs the left hip done. Continues to complain of left hand and forearm numbness and occasional pain discomfort. She frequently drops things. She was scheduled for EMG nerve conduction at last visit however she canceled the appointment. She returns for reevaluation  HISTORY: Betty Mejia is a 55 y.o. female here as a referral from Dr. Julio Sicks for trigeminal neuralgia  Symptoms started around 15 years ago, is on the left side. Currently taking tegretol  twice a day for this condition. Has been on a stable dose of tegretol for years. Notes her symptoms can be triggered by the wind, chewing, hot or cold food. Can tell symptoms are coming on due to twitching of her left eye. If she feels it coming on will take an extra tegretol and that usually calms her symptoms down. Last flare up was this past winter when she was up in [place], symptoms were resolved by dissolving a tegretol. Tolerating the tegretol well, no dizziness, mild fatigue.  Has a spinal stimulator for chronic pain, has given good  benefit. 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 Is currently working with a pain management doctor for further management.  Prior neurologist was Dr Zenia Resides in [place], number is [phone]. Recently moved to this area.     REVIEW OF SYSTEMS: Full 14 system review of systems performed and notable only for those listed, all others are neg:  Constitutional: neg  Cardiovascular: neg Ear/Nose/Throat: neg  Skin: neg Eyes: neg Respiratory: neg Gastroitestinal: neg  Hematology/Lymphatic: neg  Endocrine: neg Musculoskeletal: Joint pain and back pain. Allergy/Immunology: neg Neurological: Numbness of left hand and forearm, history of trigeminal neuralgia Psychiatric: neg Sleep : neg   ALLERGIES: No Known Allergies  HOME MEDICATIONS: Outpatient Prescriptions Prior to Visit  Medication Sig Dispense Refill  . carbamazepine (TEGRETOL) 200 MG tablet Take 1 tablet (200 mg total) by mouth 3 (three) times daily. 270 tablet 3  . Cholecalciferol (VITAMIN D PO) Take 1 tablet by mouth daily.    Marland Kitchen levothyroxine (SYNTHROID, LEVOTHROID) 75 MCG tablet Take 75 mcg by mouth daily before breakfast.    . nabumetone (RELAFEN) 500 MG tablet Take 500 mg by mouth 2 (two) times daily.    Marland Kitchen oxyCODONE-acetaminophen (ROXICET) 5-325 MG per tablet Take 1 tablet by mouth every 4 (four) hours as needed. 60 tablet 0  . pregabalin (LYRICA) 75 MG capsule Take 75 mg by mouth 2 (two) times daily.    Marland Kitchen rOPINIRole (REQUIP) 0.25 MG tablet Take 0.25 mg by mouth at bedtime as needed (restless leg syndrome). Daily at bedtime as needed  No facility-administered medications prior to visit.    PAST MEDICAL HISTORY: Past Medical History  Diagnosis Date  . Trigeminal neuralgia   . Thyroid disease   . Back pain   . Arthritis   . Hypothyroidism   . Heart murmur     as a baby  . Restless leg syndrome   . Anxiety     pt denies    PAST SURGICAL HISTORY: Past Surgical History  Procedure Laterality Date    . Vaginal hysterectomy    . Back surgery      stimulator in back  x4  . Total hip arthroplasty Right 07/22/2014    Procedure: TOTAL HIP ARTHROPLASTY;  Surgeon: Gean Birchwood, MD;  Location: [facility];  Service: Orthopedics;  Laterality: Right;  . Colonoscopy    . Spinal cord stimulator removal Right 11/21/2014    Procedure: LUMBAR SPINAL CORD STIMULATOR BATTERY REPLACEMENT;  Surgeon: Estill Bamberg, MD;  Location: [facility];  Service: Orthopedics;  Laterality: Right;  Lumbar spinal cord stimulator battery replacement    FAMILY HISTORY: Family History  Problem Relation Age of Onset  . Hypertension Mother   . Cancer Father   . Cancer Maternal Grandmother     uterus    SOCIAL HISTORY: Social History   Social History  . Marital Status: Married    Spouse Name: Betty Mejia  . Number of Children: 3  . Years of Education: 12   Occupational History  .      disabled   Social History Main Topics  . Smoking status: Never Smoker   . Smokeless tobacco: Never Used  . Alcohol Use: No  . Drug Use: No  . Sexual Activity: Not on file   Other Topics Concern  . Not on file   Social History Narrative   Patient is married with 3 children   Patient is right handed   Patient has high school education   Patient denies daily caffeine     PHYSICAL EXAM  Filed Vitals:   05/26/15 1252  BP: 118/78  Pulse: 66  Height: 5\' 5"  (1.651 m)  Weight: 195 lb 3.2 oz (88.542 kg)   Body mass index is 32.48 kg/(m^2). Generalized: Well developed, in no acute distress  Head: normocephalic and atraumatic,. Oropharynx benign  Neck: Supple, no carotid bruits  Cardiac: Regular rate rhythm, no murmur  Musculoskeletal: No deformity   Neurological examination   Mentation: Alert oriented to time, place, history taking. Follows all commands speech and language fluent  Cranial nerve II-XII: Pupils were equal round reactive to light extraocular movements were full, visual field were full on confrontational test.  Facial sensation and strength were normal. hearing was intact to finger rubbing bilaterally. Uvula tongue midline. head turning and shoulder shrug were normal and symmetric.Tongue protrusion into cheek strength was normal. Motor: normal bulk and tone, full strength in the BUE, BLE, . Fine finger movements normal, no pronator drift. Mild decreased left grip  Sensory: normal and symmetric to light touch, pinprick, and vibration positive tinels on the left Coordination: finger-nose-finger, heel-to-shin bilaterally, no dysmetria Reflexes: Brachioradialis 2/2, biceps 2/2, triceps 2/2, patellar 2/2, Achilles 2/2, plantar responses were flexor bilaterally. Gait and Station: Rising up from seated position without assistance, normal stance, moderate stride, good arm swing, smooth turning, uses single-point cane  DIAGNOSTIC DATA (LABS, IMAGING, TESTING) - I reviewed patient records, labs, notes, testing and imaging myself where available.  Lab Results  Component Value Date   WBC 6.8 11/21/2014   HGB 12.7 11/21/2014  HCT 38.9 11/21/2014   MCV 97.3 11/21/2014   PLT 264 11/21/2014      Component Value Date/Time   NA 143 11/21/2014 0842   K 4.4 11/21/2014 0842   CL 110 11/21/2014 0842   CO2 26 11/21/2014 0842   GLUCOSE 89 11/21/2014 0842   BUN 13 11/21/2014 0842   CREATININE 0.91 11/21/2014 0842   CALCIUM 8.9 11/21/2014 0842   PROT 6.9 11/21/2014 0842   ALBUMIN 3.6 11/21/2014 0842   AST 18 11/21/2014 0842   ALT 15 11/21/2014 0842   ALKPHOS 77 11/21/2014 0842   BILITOT 0.3 11/21/2014 0842   GFRNONAA >60 11/21/2014 0842   GFRAA >60 11/21/2014 0842    ASSESSMENT AND PLAN 55 y.o. year old female has a past medical history of Trigeminal neuralgia; Thyroid disease; Back pain; Arthritis; Hypothyroidism; Restless leg syndrome; and Anxiety, and  complaint of numbness in the left hand and forearm.   Will check CBZ level today and adjust if necessary.  EMG /Nerve conduction study to be done  to evaluate left hand and forearm numbness and pain Continue Tegretol Does not need refills  F/U with Dr. Hassan Buckler Darrol Angel, [facility], [facility], APRN  [facility] [address] [place], [place] [address] [phone]  Personally examined images, and have participated in and made any corrections needed to history, physical, neuro exam,assessment and plan as stated above and agree with plan.    Naomie Dean, MD Neurology [facility]

## 2015-05-27 ENCOUNTER — Telehealth: Payer: Self-pay | Admitting: Nurse Practitioner

## 2015-05-27 LAB — CARBAMAZEPINE LEVEL, TOTAL: Carbamazepine (Tegretol), S: 4.2 ug/mL (ref 4.0–12.0)

## 2015-05-27 NOTE — Telephone Encounter (Signed)
Spoke to patient relayed Tegretol level was good. Patient  Understood.

## 2015-05-27 NOTE — Telephone Encounter (Signed)
-----   Message from Nancy Carolyn Martin, NP sent at 05/27/2015  7:53 AM EST ----- °Good level of Tegretol please call the patient °

## 2015-05-27 NOTE — Progress Notes (Signed)
Quick Note:  Called and spoke to patient relayed Tegretol level was good. Patient understood. ______

## 2015-05-27 NOTE — Telephone Encounter (Signed)
-----   Message from Nilda Riggs, NP sent at 05/27/2015  7:53 AM EST ----- Good level of Tegretol please call the patient

## 2015-05-27 NOTE — Telephone Encounter (Signed)
Called and left patient a messaging asking her to call me back .

## 2015-05-28 ENCOUNTER — Ambulatory Visit (INDEPENDENT_AMBULATORY_CARE_PROVIDER_SITE_OTHER): Payer: BLUE CROSS/BLUE SHIELD | Admitting: Neurology

## 2015-05-28 ENCOUNTER — Ambulatory Visit (INDEPENDENT_AMBULATORY_CARE_PROVIDER_SITE_OTHER): Payer: Self-pay | Admitting: Neurology

## 2015-05-28 DIAGNOSIS — R202 Paresthesia of skin: Secondary | ICD-10-CM

## 2015-05-28 DIAGNOSIS — Z0289 Encounter for other administrative examinations: Secondary | ICD-10-CM

## 2015-05-28 DIAGNOSIS — R200 Anesthesia of skin: Secondary | ICD-10-CM

## 2015-05-28 NOTE — Progress Notes (Signed)
  [facility]    Provider:  Dr Lucia Gaskins Referring Provider: Jackie Plum, MD Primary Care Physician:  Jackie Plum, MD  History:  Betty Mejia is a 55 y.o. female here as a referral from Dr. Julio Sicks for left arm pain. It happens anytime. Lyrica helps. She has tingling in the whole hand and forearm in no pattern of distribution, encompasses the whole lower arm. She has neck pain and used to get epidural shots in the neck. Been going on for a long time. Unclear what exacerbates it but maybe sleeping on the left arm hurts. Achy feeling as well. Struggles with a can opener due to weakness.   Summary:   Nerve Conduction Studies were performed on the bilateral upper extremities.  The bilateral median APB motor nerves were within normal limits The bilateral Median 2nd Digit sensory nerves were within normal limits F Wave studies indicate that the bilateral Median F waves were within normal limits  Bilateral Ulnar ADM motor nerves were within normal limits. F Wave studies indicate that the bilateral Ulnar F waves have normal latencies The bilateralUlnar 5th digit sensory nerves were within normal limits.  The bilateral Radial sensory nerves were within normal limits  The median/ulnar (palm) comparison nerve showed normal peak latency difference bilaterally.   EMG needle study of selected left upper extremity muscles was performed. The following muscles were normal: Deltoid, Triceps, Biceps. Pronator Teres, Flexor Digitorum Profundus (ulnar), Extensor Digitorum Communis, Extensor Indices, Opponens Pollicis, First Dorsal Interosseous and C6, C7, C8 paraspinals were normal.   Conclusion: This is a normal study. No suggestion of mononeuropathy, polyneuropathy or radiculopathy. Clinical correlation recommended.   Betty Dean, MD  [facility] [address] [place], [place] [address]  Phone [phone] Fax [phone]

## 2015-06-01 NOTE — Procedures (Signed)
  [facility]    Provider:  Dr Lucia GaskinsAhern Referring Provider: Jackie Plumsei-Bonsu, George, MD Primary Care Physician:  Jackie PlumSEI-BONSU,GEORGE, MD  History:  Betty NimsBarbara Shuler is a 55 y.o. female here as a referral from Dr. Julio Sickssei-Bonsu for left arm pain. It happens anytime. Lyrica helps. She has tingling in the whole hand and forearm in no pattern of distribution, encompasses the whole lower arm. She has neck pain and used to get epidural shots in the neck. Been going on for a long time. Unclear what exacerbates it but maybe sleeping on the left arm hurts. Achy feeling as well. Struggles with a can opener due to weakness.   Summary:   Nerve Conduction Studies were performed on the bilateral upper extremities.  The bilateral median APB motor nerves were within normal limits The bilateral Median 2nd Digit sensory nerves were within normal limits F Wave studies indicate that the bilateral Median F waves were within normal limits  Bilateral Ulnar ADM motor nerves were within normal limits. F Wave studies indicate that the bilateral Ulnar F waves have normal latencies The bilateralUlnar 5th digit sensory nerves were within normal limits.  The bilateral Radial sensory nerves were within normal limits  The median/ulnar (palm) comparison nerve showed normal peak latency difference bilaterally.   EMG needle study of selected left upper extremity muscles was performed. The following muscles were normal: Deltoid, Triceps, Biceps. Pronator Teres, Flexor Digitorum Profundus (ulnar), Extensor Digitorum Communis, Extensor Indices, Opponens Pollicis, First Dorsal Interosseous and C6, C7, C8 paraspinals were normal.   Conclusion: This is a normal study. No suggestion of mononeuropathy, polyneuropathy or radiculopathy. Clinical correlation recommended.   Betty DeanAntonia Ahern, MD  [facility] [address] [place], [place] [address]  Phone [phone] Fax [phone]

## 2015-06-01 NOTE — Progress Notes (Signed)
See procedure note.

## 2015-06-03 ENCOUNTER — Telehealth: Payer: Self-pay | Admitting: Nurse Practitioner

## 2015-06-03 NOTE — Telephone Encounter (Signed)
-----   Message from Nilda RiggsNancy Carolyn Martin, NP sent at 06/02/2015  7:52 AM EST ----- Normal EMG/[place] study please call the patient

## 2015-06-03 NOTE — Progress Notes (Signed)
Quick Note:  Called and spoke to patient relayed NCV/EMG was normal. Patient understood message. ______

## 2015-06-03 NOTE — Telephone Encounter (Signed)
Called and spoke to patient relayed NCV/EMG was normal. Patient understood message.

## 2015-06-17 ENCOUNTER — Other Ambulatory Visit: Payer: Self-pay | Admitting: Obstetrics and Gynecology

## 2015-06-17 DIAGNOSIS — R928 Other abnormal and inconclusive findings on diagnostic imaging of breast: Secondary | ICD-10-CM

## 2015-06-24 ENCOUNTER — Ambulatory Visit
Admission: RE | Admit: 2015-06-24 | Discharge: 2015-06-24 | Disposition: A | Discharge status: Home / Self Care | Payer: BLUE CROSS/BLUE SHIELD | Source: Ambulatory Visit | Attending: Obstetrics and Gynecology | Admitting: Obstetrics and Gynecology

## 2015-06-24 DIAGNOSIS — R928 Other abnormal and inconclusive findings on diagnostic imaging of breast: Secondary | ICD-10-CM

## 2015-11-24 ENCOUNTER — Encounter: Payer: Self-pay | Admitting: Neurology

## 2015-11-24 ENCOUNTER — Ambulatory Visit (INDEPENDENT_AMBULATORY_CARE_PROVIDER_SITE_OTHER): Payer: 59 | Admitting: Neurology

## 2015-11-24 VITALS — BP 111/76 | HR 84 | Ht 65.0 in | Wt 187.4 lb

## 2015-11-24 DIAGNOSIS — G500 Trigeminal neuralgia: Secondary | ICD-10-CM | POA: Diagnosis not present

## 2015-11-24 DIAGNOSIS — Z79899 Other long term (current) drug therapy: Secondary | ICD-10-CM

## 2015-11-24 DIAGNOSIS — G5602 Carpal tunnel syndrome, left upper limb: Secondary | ICD-10-CM

## 2015-11-24 MED ORDER — CARBAMAZEPINE 200 MG PO TABS: 200.0000 mg | ORAL_TABLET | Freq: Three times a day (TID) | ORAL | 5 refills | Status: AC

## 2015-11-24 NOTE — Patient Instructions (Signed)
Remember to drink plenty of fluid, eat healthy meals and do not skip any meals. Try to eat protein with a every meal and eat a healthy snack such as fruit or nuts in between meals. Try to keep a regular sleep-wake schedule and try to exercise daily, particularly in the form of walking, 20-30 minutes a day, if you can.   As far as your medications are concerned, I would like to suggest: Continue Tegretol  As far as diagnostic testing: wear a wrist splint overnight, labs  I would like to see you back in 1 year, sooner if we need to. Please call us with any interim questions, concerns, problems, updates or refill requests.   Our phone number is [phone]. We also have an after hours call service for urgent matters and there is a physician on-call for urgent questions. For any emergencies you know to call 911 or go to the nearest emergency room

## 2015-11-24 NOTE — Progress Notes (Signed)
[facility]    Provider:  Dr Lucia Gaskins Referring Provider: Jackie Plum, MD Primary Care Physician:  Jackie Plum, MD  CC:  Trigeminal neuralgia  HPI:  Betty Mejia is a 55 y.o. female here for follow up  for trigeminal neuralgia and hand pain. She has been on tegretol for a long time, years for trigeminal neuralgia. No inciting events or trauma. She has a long history of trigeminal neuralgia. Tegretol works. She takes it 200mg  twice a day. She is happy with the tegretol, no side effects, not interested in surgical options. Still with left hand pain and tingling and numbness. Worse in the mornings where she has to shake out her hands. Numbness and tingling in the fingers. Pain in the wrists. Worse with use. EMG/NCS was negative but symptoms consistent with CTS. She has weak grip, dropping objects.    Review of Systems: Patient complains of symptoms per HPI as well as the following symptoms: Eye itching, eye redness, blurred vision, runny nose, weakness, restless legs. Pertinent negatives per HPI. All others negative.   Social History   Social History  . Marital status: Married    Spouse name: Iantha Fallen  . Number of children: 3  . Years of education: 12   Occupational History  .      disabled   Social History Main Topics  . Smoking status: Never Smoker  . Smokeless tobacco: Never Used  . Alcohol use No  . Drug use: No  . Sexual activity: Not on file   Other Topics Concern  . Not on file   Social History Narrative   Patient is married with 3 children   Patient is right handed   Patient has high school education   Patient denies daily caffeine    Family History  Problem Relation Age of Onset  . Hypertension Mother   . Cancer Father   . Cancer Maternal Grandmother     uterus    Past Medical History:  Diagnosis Date  . Anxiety    pt denies  . Arthritis   . Back pain   . Heart murmur    as a baby  . Hypothyroidism   . Restless leg  syndrome   . Thyroid disease   . Trigeminal neuralgia     Past Surgical History:  Procedure Laterality Date  . BACK SURGERY     stimulator in back  x4  . COLONOSCOPY    . SPINAL CORD STIMULATOR REMOVAL Right 11/21/2014   Procedure: LUMBAR SPINAL CORD STIMULATOR BATTERY REPLACEMENT;  Surgeon: Estill Bamberg, MD;  Location: [facility] OR;  Service: Orthopedics;  Laterality: Right;  Lumbar spinal cord stimulator battery replacement  . TOTAL HIP ARTHROPLASTY Right 07/22/2014   Procedure: TOTAL HIP ARTHROPLASTY;  Surgeon: Gean Birchwood, MD;  Location: [facility] OR;  Service: Orthopedics;  Laterality: Right;  Marland Kitchen VAGINAL HYSTERECTOMY      Current Outpatient Prescriptions  Medication Sig Dispense Refill  . carbamazepine (TEGRETOL) 200 MG tablet Take 1 tablet (200 mg total) by mouth 3 (three) times daily. 270 tablet 3  . Cholecalciferol (VITAMIN D PO) Take 2,000 Units by mouth daily.     Marland Kitchen levothyroxine (SYNTHROID, LEVOTHROID) 100 MCG tablet Take 100 mcg by mouth daily before breakfast.    . nabumetone (RELAFEN) 500 MG tablet Take 500 mg by mouth 2 (two) times daily.    . pregabalin (LYRICA) 75 MG capsule Take 75 mg by mouth 2 (two) times daily.    Marland Kitchen rOPINIRole (REQUIP) 0.25 MG tablet Take  0.25 mg by mouth at bedtime as needed (restless leg syndrome). Daily at bedtime as needed     No current facility-administered medications for this visit.     Allergies as of 11/24/2015  . (No Known Allergies)    Vitals: BP 111/76   Pulse 84   Ht 5\' 5"  (1.651 m)   Wt 187 lb 6.4 oz (85 kg)   BMI 31.18 kg/m  Last Weight:  Wt Readings from Last 1 Encounters:  11/24/15 187 lb 6.4 oz (85 kg)   Last Height:   Ht Readings from Last 1 Encounters:  11/24/15 5\' 5"  (1.651 m)   Physical exam: Exam: Gen: NAD, conversant, well nourised, obese, well groomed                     Eyes: Conjunctivae clear without exudates or hemorrhage  Neuro: Detailed Neurologic Exam  Speech:    Speech is normal; fluent and spontaneous  with normal comprehension.  Cognition:    The patient is oriented to person, place, and time;     recent and remote memory intact;     language fluent;     normal attention, concentration,     fund of knowledge Cranial Nerves:    The pupils are equal, round, and reactive to light. The fundi are normal and spontaneous venous pulsations are present. Visual fields are full to finger confrontation. Extraocular movements are intact. Trigeminal sensation is intact and the muscles of mastication are normal. The face is symmetric. The palate elevates in the midline. Hearing intact. Voice is normal. Shoulder shrug is normal. The tongue has normal motion without fasciculations.     Motor Observation:    No asymmetry, no atrophy, and no involuntary movements noted. Tone:    Normal muscle tone.    +Tinel's sign at the wrist, +Phalen's maneuver.      Assessment/Plan:  55 year old with trigeminal neuralgia treated with tegretol for years. 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  - continue tegretol. Will take labs today - supportive measures for CTS, wrist splints. If worsens will repeat emg/ncs.  - followup 1 year  Naomie DeanAntonia Dylana Shaw, MD  [facility] [address] [place], [place] [address]  Phone [phone] Fax [phone]  A total of 30 minutes was spent in with this patient face-to-face. Over half this time was spent on counseling patient on the trigeminal neuralgia and CTS diagnosis and different therapeutic options available.

## 2015-11-25 ENCOUNTER — Telehealth: Payer: Self-pay | Admitting: *Deleted

## 2015-11-25 LAB — COMPREHENSIVE METABOLIC PANEL
A/G RATIO: 1.3 (ref 1.2–2.2)
ALT: 11 IU/L (ref 0–32)
AST: 11 IU/L (ref 0–40)
Albumin: 4 g/dL (ref 3.5–5.5)
Alkaline Phosphatase: 72 IU/L (ref 39–117)
BUN / CREAT RATIO: 15 (ref 9–23)
BUN: 13 mg/dL (ref 6–24)
CALCIUM: 9.1 mg/dL (ref 8.7–10.2)
CHLORIDE: 105 mmol/L (ref 96–106)
CO2: 24 mmol/L (ref 18–29)
Creatinine, Ser: 0.89 mg/dL (ref 0.57–1.00)
GFR, EST AFRICAN AMERICAN: 84 mL/min/{1.73_m2} (ref >59)
GFR, EST NON AFRICAN AMERICAN: 73 mL/min/{1.73_m2} (ref >59)
GLOBULIN, TOTAL: 3.1 g/dL (ref 1.5–4.5)
Glucose: 90 mg/dL (ref 65–99)
POTASSIUM: 5.6 mmol/L — AB (ref 3.5–5.2)
SODIUM: 145 mmol/L — AB (ref 134–144)
Total Protein: 7.1 g/dL (ref 6.0–8.5)

## 2015-11-25 LAB — CBC
HEMOGLOBIN: 13.1 g/dL (ref 11.1–15.9)
Hematocrit: 41.2 % (ref 34.0–46.6)
MCH: 31.2 pg (ref 26.6–33.0)
MCHC: 31.8 g/dL (ref 31.5–35.7)
MCV: 98 fL — ABNORMAL HIGH (ref 79–97)
PLATELETS: 341 10*3/uL (ref 150–379)
RBC: 4.2 x10E6/uL (ref 3.77–5.28)
RDW: 13.8 % (ref 12.3–15.4)
WBC: 8 10*3/uL (ref 3.4–10.8)

## 2015-11-25 NOTE — Telephone Encounter (Signed)
Tried calling pt. Bad reception. Unable to hear patient. Will try again later.   *If patient calls, ok to inform patient labs unremarkable per Dr Lucia GaskinsAhern.

## 2015-11-25 NOTE — Telephone Encounter (Signed)
-----   Message from Anson FretAntonia B Ahern, MD sent at 11/25/2015 12:40 PM EDT ----- Labs unremarkable

## 2015-11-27 NOTE — Telephone Encounter (Signed)
Called and spoke to patient about unremarkable labs per Dr Ahern. Pt verbalized understanding.  

## 2016-11-23 ENCOUNTER — Encounter: Payer: Self-pay | Admitting: Neurology

## 2016-11-23 ENCOUNTER — Ambulatory Visit (INDEPENDENT_AMBULATORY_CARE_PROVIDER_SITE_OTHER): Payer: 59 | Admitting: Neurology

## 2016-11-23 VITALS — BP 107/65 | HR 103 | Ht 65.0 in | Wt 170.8 lb

## 2016-11-23 DIAGNOSIS — G243 Spasmodic torticollis: Secondary | ICD-10-CM | POA: Diagnosis not present

## 2016-11-23 DIAGNOSIS — M542 Cervicalgia: Secondary | ICD-10-CM | POA: Diagnosis not present

## 2016-11-23 NOTE — Progress Notes (Signed)
[facility]    Provider:  Dr Lucia Gaskins Referring Provider: Jackie Plum, MD Primary Care Physician:  Jackie Plum, MD  CC:  Trigeminal neuralgia, new problem cervical dystonia  Interval history 11/23/2016: She is still on Tegretol. 2 days ago started having some pain in her neck and back. She had injections in her neck for arthritis last month which helped. She has been going to physical therapy but the muscles have become sore. 2 days ago she went to take a shower and she couldn;t bend over she has nerve pain shooting down her neck to her feet, it feels like nerves not muscles. She did have imaging of the brain in [place] after being diagnosed with the trigeminal neuralgia and had a workup with neurology in the past. They recommended surgery for what sounds like vascular compression of the 5th cranial nerve but she declined. She declines, only wants to take Tegretol. She did have an MRi of the cervical spine which showed arthritis and when she had the shots. Her muscles are tender and sore, she did physical therapy, continued pain, decreased range of motion of the neck. Elevated and hypertrophied cervical muscles. Torticollis to the right and laterocollis to the left. Muscles have been bothering her for years and progressively worsening. Has tried heat, percocet, flexeril without relief. Recommended dry needling and if that fails botox for cervical dystonia.   HPI:  Betty Mejia is a 56 y.o. female here for follow up  for trigeminal neuralgia and hand pain. She has been on tegretol for a long time, years for trigeminal neuralgia. No inciting events or trauma. She has a long history of trigeminal neuralgia. Tegretol works. She takes it 200mg  twice a day. She is happy with the tegretol, no side effects, not interested in surgical options. Still with left hand pain and tingling and numbness. Worse in the mornings where she has to shake out her hands. Numbness and tingling in the  fingers. Pain in the wrists. Worse with use. EMG/NCS was negative but symptoms consistent with CTS. She has weak grip, dropping objects.    Review of Systems: Patient complains of symptoms per HPI as well as the following symptoms: Numbness, weakness, joint pain, joint swelling, back pain, aching muscles, muscle cramps, walking difficulty, neck pain, neck stiffness, restless legs. Pertinent negatives per HPI. All others negative.  Social History   Social History  . Marital status: Married    Spouse name: Iantha Fallen  . Number of children: 3  . Years of education: 12   Occupational History  .      disabled   Social History Main Topics  . Smoking status: Never Smoker  . Smokeless tobacco: Never Used  . Alcohol use No  . Drug use: No  . Sexual activity: Not on file   Other Topics Concern  . Not on file   Social History Narrative   Patient is married with 3 children   Patient is right handed   Patient has high school education   Patient denies daily caffeine    Family History  Problem Relation Age of Onset  . Hypertension Mother   . Cancer Father   . Cancer Maternal Grandmother        uterus    Past Medical History:  Diagnosis Date  . Anxiety    pt denies  . Arthritis   . Back pain   . Heart murmur    as a baby  . Hypothyroidism   . Restless leg syndrome   .  Thyroid disease   . Trigeminal neuralgia     Past Surgical History:  Procedure Laterality Date  . BACK SURGERY     stimulator in back  x4  . COLONOSCOPY    . SPINAL CORD STIMULATOR REMOVAL Right 11/21/2014   Procedure: LUMBAR SPINAL CORD STIMULATOR BATTERY REPLACEMENT;  Surgeon: Estill Bamberg, MD;  Location: [facility];  Service: Orthopedics;  Laterality: Right;  Lumbar spinal cord stimulator battery replacement  . TOTAL HIP ARTHROPLASTY Right 07/22/2014   Procedure: TOTAL HIP ARTHROPLASTY;  Surgeon: Gean Birchwood, MD;  Location: [facility] OR;  Service: Orthopedics;  Laterality: Right;  Marland Kitchen VAGINAL HYSTERECTOMY       Current Outpatient Prescriptions  Medication Sig Dispense Refill  . carbamazepine (TEGRETOL) 200 MG tablet Take 1 tablet (200 mg total) by mouth 3 (three) times daily. 270 tablet 5  . Cholecalciferol (VITAMIN D PO) Take 2,000 Units by mouth daily.     . cyclobenzaprine (FLEXERIL) 10 MG tablet Take 10 mg by mouth as needed.  0  . levothyroxine (SYNTHROID, LEVOTHROID) 100 MCG tablet Take 100 mcg by mouth daily before breakfast.    . nabumetone (RELAFEN) 500 MG tablet Take 500 mg by mouth as needed.     Marland Kitchen oxyCODONE-acetaminophen (PERCOCET/ROXICET) 5-325 MG tablet Take 1 tablet by mouth as needed.  0  . rOPINIRole (REQUIP) 0.25 MG tablet Take 0.25 mg by mouth at bedtime as needed (restless leg syndrome). Daily at bedtime as needed     No current facility-administered medications for this visit.     Allergies as of 11/23/2016  . (No Known Allergies)    Vitals: BP 107/65 (BP Location: Right Arm, Patient Position: Sitting, Cuff Size: Normal)   Pulse (!) 103   Ht 5\' 5"  (1.651 m)   Wt 170 lb 12.8 oz (77.5 kg)   BMI 28.42 kg/m  Last Weight:  Wt Readings from Last 1 Encounters:  11/23/16 170 lb 12.8 oz (77.5 kg)   Last Height:   Ht Readings from Last 1 Encounters:  11/23/16 5\' 5"  (1.651 m)    MSK: Elevated and hypertrophied trapezius muscles. Torticollis to the right and laterocollis to the left. Decreased ROM of neck.    Neuro: Detailed Neurologic Exam  Speech:    Speech is normal; fluent and spontaneous with normal comprehension.  Cognition:    The patient is oriented to person, place, and time;     recent and remote memory intact;     language fluent;     normal attention, concentration,     fund of knowledge Cranial Nerves:    The pupils are equal, round, and reactive to light. The fundi are normal and spontaneous venous pulsations are present. Visual fields are full to finger confrontation. Extraocular movements are intact. Trigeminal sensation is intact and the  muscles of mastication are normal. The face is symmetric. The palate elevates in the midline. Hearing intact. Voice is normal. Shoulder shrug is normal. The tongue has normal motion without fasciculations.     Motor Observation:    No asymmetry, no atrophy, and no involuntary movements noted. Tone:    Normal muscle tone.    +Tinel's sign at the wrist, +Phalen's maneuver.      Assessment/Plan:  56 year old with trigeminal neuralgia treated with tegretol for years. 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 Elevated and hypertrophied cervical muscles. Torticollis to the right and laterocollis to the left. Muscles have  been bothering her for years and progressively worsening. Has tried heat, percocet, flexeril without relief.   -Recommended dry needling and if that fails botox for cervical dystonia. 500 u dysport  - continue tegretol for TGN - supportive measures for CTS, wrist splints. If worsens will repeat emg/ncs.  - dry needling - Botox for cervical dystonia  Naomie Dean, MD  [facility] [address] [place], [place] [address]  Phone [phone] Fax [phone]  A total of 30 minutes was spent face-to-face with this patient. Over half this time was spent on counseling patient on the TG, cervical dystonia diagnosis and different diagnostic and therapeutic options available.

## 2016-12-17 ENCOUNTER — Ambulatory Visit: Payer: 59 | Admitting: Neurology

## 2016-12-30 ENCOUNTER — Telehealth: Payer: Self-pay | Admitting: Neurology

## 2016-12-30 NOTE — Telephone Encounter (Signed)
Pt has called back stating she is returning the call to [facility],  please call

## 2016-12-30 NOTE — Telephone Encounter (Signed)
I spoke with the patient and told her that her insurance considers dysport an exclusions. They will not pay for this medication. The patient would like to know if there is something else she can try instead of the dysport. Please call and advise.

## 2016-12-31 NOTE — Telephone Encounter (Signed)
Betty Mejia, We can use botox or xeomin, will that be approved?  Baird Lyons, I recommend dry needling, we can place a referral for PT and in the comments place "dry needling" for cervical myofascial pain if she has never tried that thanks

## 2017-01-03 ENCOUNTER — Ambulatory Visit: Payer: 59 | Admitting: Neurology

## 2017-01-03 NOTE — Telephone Encounter (Signed)
Called patient, no answer, LVM for patient to call back.

## 2017-01-03 NOTE — Telephone Encounter (Signed)
Pt returned call. I told her Duwayne Heck would look into the botox and xeomin meds and let us know if insurance would approve those options.  I informed the pt that Dr Lucia Gaskins is recommending the dry needling. I explained to the patient what this was. Pt would like to do a little research before agreeing to proceed further with referral. Pt stated that she would call us back and let us know what she decides. At that time once patient agrees we can place order

## 2017-01-04 NOTE — Telephone Encounter (Signed)
Botox and Xeomin are also exclusions on her plan. I asked when I was given the initial denial. I informed the patient of this when I spoke with her on 10/04.

## 2017-09-07 ENCOUNTER — Telehealth: Payer: Self-pay | Admitting: Neurology

## 2017-09-07 ENCOUNTER — Ambulatory Visit (INDEPENDENT_AMBULATORY_CARE_PROVIDER_SITE_OTHER): Payer: 59 | Admitting: Neurology

## 2017-09-07 ENCOUNTER — Encounter: Payer: Self-pay | Admitting: Neurology

## 2017-09-07 VITALS — BP 120/76 | HR 77 | Ht 65.0 in | Wt 182.0 lb

## 2017-09-07 DIAGNOSIS — G243 Spasmodic torticollis: Secondary | ICD-10-CM

## 2017-09-07 DIAGNOSIS — G500 Trigeminal neuralgia: Secondary | ICD-10-CM

## 2017-09-07 DIAGNOSIS — G5603 Carpal tunnel syndrome, bilateral upper limbs: Secondary | ICD-10-CM

## 2017-09-07 DIAGNOSIS — M436 Torticollis: Secondary | ICD-10-CM

## 2017-09-07 NOTE — Telephone Encounter (Signed)
Dr. Lucia GaskinsAhern , Will give me [employer] work for Botox . Patient is on schedule .

## 2017-09-07 NOTE — Progress Notes (Signed)
[facility]    Provider:  Dr Lucia Gaskins Referring Provider: Jackie Plum, MD Primary Care Physician:  Mariel Sleet, PA-C  CC:  Trigeminal neuralgia, new problem cervical dystonia  Interval history 11/23/2016: She is still on Tegretol. 2 days ago started having some pain in her neck and back. She had injections in her neck for arthritis last month which helped. She has been going to physical therapy but the muscles have become sore. 2 days ago she went to take a shower and she couldn;t bend over she has nerve pain shooting down her neck to her feet, it feels like nerves not muscles. She did have imaging of the brain in [place] after being diagnosed with the trigeminal neuralgia and had a workup with neurology in the past. They recommended surgery for what sounds like vascular compression of the 5th cranial nerve but she declined. She declines, only wants to take Tegretol. She did have an MRi of the cervical spine which showed arthritis and when she had the shots. Her muscles are tender and sore, she did physical therapy, continued pain, decreased range of motion of the neck. Elevated and hypertrophied cervical muscles. Torticollis to the right and laterocollis to the left. Muscles have been bothering her for years and progressively worsening. Has tried heat, percocet, flexeril without relief. Recommended dry needling and if that fails botox for cervical dystonia.   HPI:  Betty Mejia is a 57 y.o. female here for follow up  for trigeminal neuralgia and hand pain. She has been on tegretol for a long time, years for trigeminal neuralgia. No inciting events or trauma. She has a long history of trigeminal neuralgia. Tegretol works. She takes it 200mg  twice a day. She is happy with the tegretol, no side effects, not interested in surgical options. Still with left hand pain and tingling and numbness. Worse in the mornings where she has to shake out her hands. Numbness and tingling in  the fingers. Pain in the wrists. Worse with use. EMG/NCS was negative but symptoms consistent with CTS. She has weak grip, dropping objects.    Review of Systems: Patient complains of symptoms per HPI as well as the following symptoms: Numbness, weakness, joint pain, joint swelling, back pain, aching muscles, muscle cramps, walking difficulty, neck pain, neck stiffness, restless legs. Pertinent negatives per HPI. All others negative.  Social History   Socioeconomic History  . Marital status: Married    Spouse name: Adreanne Yono  . Number of children: 3  . Years of education: 20  . Highest education level: Not on file  Occupational History    Comment: disabled  Social Needs  . Financial resource strain: Not on file  . Food insecurity:    Worry: Not on file    Inability: Not on file  . Transportation needs:    Medical: Not on file    Non-medical: Not on file  Tobacco Use  . Smoking status: Never Smoker  . Smokeless tobacco: Never Used  Substance and Sexual Activity  . Alcohol use: No  . Drug use: No  . Sexual activity: Not on file  Lifestyle  . Physical activity:    Days per week: Not on file    Minutes per session: Not on file  . Stress: Not on file  Relationships  . Social connections:    Talks on phone: Not on file    Gets together: Not on file    Attends religious service: Not on file    Active  member of club or organization: Not on file    Attends meetings of clubs or organizations: Not on file    Relationship status: Not on file  . Intimate partner violence:    Fear of current or ex partner: Not on file    Emotionally abused: Not on file    Physically abused: Not on file    Forced sexual activity: Not on file  Other Topics Concern  . Not on file  Social History Narrative   Patient is married with 3 children   Patient is right handed   Patient has high school education   Caffeine: coffee every now and then    Family History  Problem Relation Age of  Onset  . Hypertension Mother   . Cancer Father   . Cancer Maternal Grandmother        uterus    Past Medical History:  Diagnosis Date  . Anxiety    pt denies  . Arthritis    neck and upper back   . Back pain   . Heart murmur    as a baby  . Hypothyroidism   . Restless leg syndrome   . Thyroid disease   . Trigeminal neuralgia     Past Surgical History:  Procedure Laterality Date  . BACK SURGERY     stimulator in back  x4  . COLONOSCOPY    . SPINAL CORD STIMULATOR INSERTION    . SPINAL CORD STIMULATOR REMOVAL Right 11/21/2014   Procedure: LUMBAR SPINAL CORD STIMULATOR BATTERY REPLACEMENT;  Surgeon: Estill BambergMark Dumonski, MD;  Location: [facility] OR;  Service: Orthopedics;  Laterality: Right;  Lumbar spinal cord stimulator battery replacement  . TOTAL HIP ARTHROPLASTY Right 07/22/2014   Procedure: TOTAL HIP ARTHROPLASTY;  Surgeon: Gean BirchwoodFrank Rowan, MD;  Location: [facility] OR;  Service: Orthopedics;  Laterality: Right;  Marland Kitchen. VAGINAL HYSTERECTOMY     partial     Current Outpatient Medications  Medication Sig Dispense Refill  . carbamazepine (TEGRETOL) 200 MG tablet Take 1 tablet (200 mg total) by mouth 3 (three) times daily. 270 tablet 5  . Cholecalciferol (VITAMIN D PO) Take 2,000 Units by mouth daily.     . cyclobenzaprine (FLEXERIL) 10 MG tablet Take 10 mg by mouth as needed.  0  . gabapentin (NEURONTIN) 300 MG capsule Take by mouth. 1-2 capsules once during the day and 2 capsules at night  3  . levothyroxine (SYNTHROID, LEVOTHROID) 112 MCG tablet Take 112 mcg by mouth daily.    . methocarbamol (ROBAXIN) 500 MG tablet Take 1 tablet (500 mg total) by mouth 3 (three) times daily as needed for up to 30 days.  0  . montelukast (SINGULAIR) 10 MG tablet Take 10 mg by mouth daily.    Marland Kitchen. oxyCODONE-acetaminophen (PERCOCET) 7.5-325 MG tablet Take 1 tablet by mouth every 8 (eight) hours as needed.  0  . rOPINIRole (REQUIP) 0.25 MG tablet Take 0.25 mg by mouth at bedtime as needed (restless leg syndrome). Daily at  bedtime as needed     No current facility-administered medications for this visit.     Allergies as of 09/07/2017 - Review Complete 09/07/2017  Allergen Reaction Noted  . Hydrocodone-acetaminophen Nausea And Vomiting 10/31/2016  . Amoxicillin  04/01/2016    Vitals: BP 120/76 (BP Location: Right Arm, Patient Position: Sitting)   Pulse 77   Ht 5\' 5"  (1.651 m)   Wt 182 lb (82.6 kg)   BMI 30.29 kg/m  Last Weight:  Wt Readings from Last 1 Encounters:  09/07/17 182 lb (82.6 kg)   Last Height:   Ht Readings from Last 1 Encounters:  09/07/17 5\' 5"  (1.651 m)    MSK: Elevated and hypertrophied trapezius muscles. Torticollis to the right and laterocollis to the left. Decreased ROM of neck.    Neuro: Detailed Neurologic Exam  Speech:    Speech is normal; fluent and spontaneous with normal comprehension.  Cognition:    The patient is oriented to person, place, and time;     recent and remote memory intact;     language fluent;     normal attention, concentration,     fund of knowledge Cranial Nerves:    The pupils are equal, round, and reactive to light. The fundi are normal and spontaneous venous pulsations are present. Visual fields are full to finger confrontation. Extraocular movements are intact. Trigeminal sensation is intact and the muscles of mastication are normal. The face is symmetric. The palate elevates in the midline. Hearing intact. Voice is normal. Shoulder shrug is normal. The tongue has normal motion without fasciculations.     Motor Observation:    No asymmetry, no atrophy, and no involuntary movements noted. Tone:    Normal muscle tone.    +Tinel's sign at the wrist, +Phalen's maneuver.      Assessment/Plan:  57 year old with trigeminal neuralgia treated with tegretol for years. 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 Elevated and hypertrophied left shoulder.  Torticollis to the right and laterocollis to the left. Muscles have been bothering her for years and progressively worsening. Has tried heat, percocet, flexeril, PT and other interventions without relief and muscle relaxers cause sedation, she has decreased range of motion and chronic neck pain for over a year, progressively worsening and causing functional disability, difficulty turning her head to drive.   Refractory cervical dystonia worsening: botox for cervical dystonia. 200 u botox: 200 for left trap, 50 u left SCM. 50 u Left levator scapulae to decrease pain, increase range of motion, increase life functionality  - continue tegretol for TGN, stable - supportive measures for CTS, wrist splints. If worsens will repeat emg/ncs, stable   Naomie Dean, MD  [facility] [address] [place], [place] [address]  Phone [phone] Fax [phone]  A total of 25 minutes was spent face-to-face with this patient. Over half this time was spent on counseling patient on the TG, cervical dystonia diagnosis and different diagnostic and therapeutic options available.

## 2017-09-07 NOTE — Telephone Encounter (Signed)
botox start form initiated for cervical dystonia.

## 2017-09-07 NOTE — Telephone Encounter (Signed)
Botox packed signed by Dr. Lucia GaskinsAhern. [facility].

## 2017-09-08 NOTE — Telephone Encounter (Signed)
Patient has medicare and [employer]. I called [employer] to check to see if Berkley Harveyauth is required and I spoke to Coralee Northina she stated No prior auth for Botox J0585 & G24.8. Ref # [id number]. She informed me that medical recorders may be requested for processing claims. She stated it is not B/B and to do it through [facility]. I faxed clinical notes to [facility]

## 2017-09-14 NOTE — Telephone Encounter (Signed)
[facility] [phone] called to advise botox will be delivered on 09/19/17. She aware pt's appt is 7/22. [facility]

## 2017-09-14 NOTE — Telephone Encounter (Signed)
I spoke with the patient regarding her Botox medication. She states that she has a 900 dollar copay and she can't afford that. She wanted to know if she could get the reimbursement card. I told her that since she has medicare I did not think she would be elidgible but she would have to ask them and I provided her with the number. She stated that she didn't understand and that this "wasn't her job" and told me to call her back after I talk with the pharmacy.   I spoke with Randa EvensJoanne at the pharmacy who stated that the patient does in fact a 900 dollar copay. This is because Botox is a plan exclusion as previously stated in the October 2018 note. I canceled the order for botox.   I called and made the patient aware, she asked me if she should call the reimbursement program. I said yes. She is going to call me back.

## 2017-10-17 ENCOUNTER — Ambulatory Visit (INDEPENDENT_AMBULATORY_CARE_PROVIDER_SITE_OTHER): Payer: 59 | Admitting: Neurology

## 2017-10-17 ENCOUNTER — Other Ambulatory Visit: Payer: Self-pay | Admitting: *Deleted

## 2017-10-17 DIAGNOSIS — G243 Spasmodic torticollis: Secondary | ICD-10-CM

## 2017-10-17 DIAGNOSIS — M7918 Myalgia, other site: Secondary | ICD-10-CM

## 2017-10-17 MED ORDER — KETOROLAC TROMETHAMINE 60 MG/2ML IM SOLN
60.0000 mg | Freq: Once | INTRAMUSCULAR | Status: AC
  Administered 2017-10-17: 60 mg via INTRAMUSCULAR

## 2017-10-17 NOTE — Progress Notes (Addendum)
Botox- 100 units x 2 vials Lot: [id number] Expiration: 04/2020 NDC: [id number]  Bacteriostatic 0.9% Sodium Chloride- 4mL total Lot: [id number] Expiration: 07/28/2018 NDC: [id number]  Dx: G24.3 samples  Toradol 60 mg IM injection given per order in L ventrogluteal musce. Pt tolerated well.

## 2017-10-17 NOTE — Progress Notes (Signed)
Procedure note for Spasmodic torticollis:  EMG: EMG guidance was used to inject muscles detailed below. Aseptic procedure was performed and patient tolerated procedure. Procedure was performed by Dr. Azell DerA Maybel Dambrosio   Patient tolerated the procedure.   Refractory to oral medications   Motrin / tylenol for injections site pain / soreness   REMS precautions handout given to patient   RTC - see instructions for details   Wasted0Units.   Botox -200units [id number]    DX: G24.3 Buy and Bill  Muscles injected:Muscles injected:  20u left and 20 right levator scapula. 120  righttrapezius, 40 left trapezius  Will increase to 300 units botox for improved pain control and range of motion of neck

## 2017-10-17 NOTE — Patient Instructions (Signed)
Tizanidine tablets or capsules What is this medicine? TIZANIDINE (tye ZAN i deen) helps to relieve muscle spasms. It may be used to help in the treatment of multiple sclerosis and spinal cord injury. This medicine may be used for other purposes; ask your health care provider or pharmacist if you have questions. COMMON BRAND NAME(S): Zanaflex What should I tell my health care provider before I take this medicine? They need to know if you have any of these conditions: -kidney disease -liver disease -low blood pressure -mental disorder -an unusual or allergic reaction to tizanidine, other medicines, lactose (tablets only), foods, dyes, or preservatives -pregnant or trying to get pregnant -breast-feeding How should I use this medicine? Take this medicine by mouth with a full glass of water. Take this medicine on an empty stomach, at least 30 minutes before or 2 hours after food. Do not take with food unless you talk with your doctor. Follow the directions on the prescription label. Take your medicine at regular intervals. Do not take your medicine more often than directed. Do not stop taking except on your doctor's advice. Suddenly stopping the medicine can be very dangerous. Talk to your pediatrician regarding the use of this medicine in children. Patients over 65 years old may have a stronger reaction and need a smaller dose. Overdosage: If you think you have taken too much of this medicine contact a poison control center or emergency room at once. NOTE: This medicine is only for you. Do not share this medicine with others. What if I miss a dose? If you miss a dose, take it as soon as you can. If it is almost time for your next dose, take only that dose. Do not take double or extra doses. What may interact with this medicine? Do not take this medicine with any of the following medications: -ciprofloxacin -cisapride -dofetilide -dronedarone -fluvoxamine -narcotic medicines for  cough -pimozide -thiabendazole -thioridazine -ziprasidone This medicine may also interact with the following medications: -acyclovir -alcohol -antihistamines for allergy, cough and cold -baclofen -certain antibiotics like levofloxacin, ofloxacin -certain medicines for anxiety or sleep -certain medicines for blood pressure, heart disease, irregular heart beat -certain medicines for depression like amitriptyline, fluoxetine, sertraline -certain medicines for seizures like phenobarbital, primidone -certain medicines for stomach problems like cimetidine, famotidine -female hormones, like estrogens or progestins and birth control pills, patches, rings, or injections -general anesthetics like halothane, isoflurane, methoxyflurane, propofol -local anesthetics like lidocaine, pramoxine, tetracaine -medicines that relax muscles for surgery -narcotic medicines for pain -other medicines that prolong the QT interval (cause an abnormal heart rhythm) -phenothiazines like chlorpromazine, mesoridazine, prochlorperazine -ticlopidine -zileuton This list may not describe all possible interactions. Give your health care provider a list of all the medicines, herbs, non-prescription drugs, or dietary supplements you use. Also tell them if you smoke, drink alcohol, or use illegal drugs. Some items may interact with your medicine. What should I watch for while using this medicine? Tell your doctor or health care professional if your symptoms do not start to get better or if they get worse. You may get drowsy or dizzy. Do not drive, use machinery, or do anything that needs mental alertness until you know how this medicine affects you. Do not stand or sit up quickly, especially if you are an older patient. This reduces the risk of dizzy or fainting spells. Alcohol may interfere with the effect of this medicine. Avoid alcoholic drinks. If you are taking another medicine that also causes drowsiness, you may have  more side   effects. Give your health care provider a list of all medicines you use. Your doctor will tell you how much medicine to take. Do not take more medicine than directed. Call emergency for help if you have problems breathing or unusual sleepiness. Your mouth may get dry. Chewing sugarless gum or sucking hard candy, and drinking plenty of water may help. Contact your doctor if the problem does not go away or is severe. What side effects may I notice from receiving this medicine? Side effects that you should report to your doctor or health care professional as soon as possible: -allergic reactions like skin rash, itching or hives, swelling of the face, lips, or tongue -breathing problems -hallucinations -signs and symptoms of liver injury like dark yellow or brown urine; general ill feeling or flu-like symptoms; light-colored stools; loss of appetite; nausea; right upper quadrant belly pain; unusually weak or tired; yellowing of the eyes or skin -signs and symptoms of low blood pressure like dizziness; feeling faint or lightheaded, falls; unusually weak or tired -unusually slow heartbeat -unusually weak or tired Side effects that usually do not require medical attention (report to your doctor or health care professional if they continue or are bothersome): -blurred vision -constipation -dizziness -dry mouth -tiredness This list may not describe all possible side effects. Call your doctor for medical advice about side effects. You may report side effects to FDA at 1-800-FDA-1088. Where should I keep my medicine? Keep out of the reach of children. Store at room temperature between 15 and 30 degrees C (59 and 86 degrees F). Throw away any unused medicine after the expiration date. NOTE: This sheet is a summary. It may not cover all possible information. If you have questions about this medicine, talk to your doctor, pharmacist, or health care provider.  2018 Elsevier/Gold Standard  (2014-12-24 13:52:12)  

## 2017-10-18 ENCOUNTER — Telehealth: Payer: Self-pay | Admitting: Neurology

## 2017-10-18 MED ORDER — KETOROLAC TROMETHAMINE 60 MG/2ML IM SOLN: INTRAMUSCULAR | 0 refills | Status: DC

## 2017-10-18 NOTE — Addendum Note (Signed)
Addended by: Bertram SavinULBERTSON, Azaliah Carrero L on: 10/18/2017 05:34 PM   Modules accepted: Orders

## 2017-10-18 NOTE — Telephone Encounter (Signed)
Spoke to Dr. Lucia GaskinsAhern, we are going to disregard unit change due to cost to patient.

## 2017-10-18 NOTE — Addendum Note (Signed)
Addended by: Bertram SavinULBERTSON, BETHANY L on: 10/18/2017 05:20 PM   Modules accepted: Orders

## 2017-10-18 NOTE — Telephone Encounter (Addendum)
Toradol order reviewed by Dr. Lucia GaskinsAhern. Pt was instructed yesterday in the office visit on the sites she can use, cleanse with alcohol prior to injection.   Order printed, signed by Dr. Lucia GaskinsAhern & faxed to pharmacy. Received a receipt of confirmation.

## 2017-10-18 NOTE — Telephone Encounter (Signed)
Would you please order toradol 30-60g injections for acute pain to patient please? Thanks!

## 2017-10-18 NOTE — Telephone Encounter (Signed)
Can you order 300 units of botox next time for patient's dystonia? thanks

## 2017-10-19 NOTE — Telephone Encounter (Signed)
I called the patient to make her aware [facility] can come in for her next injection.

## 2017-10-19 NOTE — Telephone Encounter (Signed)
Patient called and we discussed the reimbursement program. I informed her that she would have to get her medication from the [facility] pharmacy for her next apt. She said she was going to call the reimbursement program.

## 2017-10-19 NOTE — Telephone Encounter (Signed)
Patient called and we discussed the reimbursement program. I informed her that she would have to get her medication from [facility] for her next apt. She said she was going to call the reimbursement program.  °

## 2017-10-19 NOTE — Telephone Encounter (Signed)
Tiffany with [facility] calling stating insurance will not cover ketorolac (TORADOL) 60 MG/2ML SOLN injection. Says usually is given in doctor's office .Please call and discuss.

## 2017-10-19 NOTE — Telephone Encounter (Signed)
Spoke with Tiffany @ [facility] and informed her that we would have the pt pay cash if she likes and can use a goodrx coupon. Tiffany stated that Onalee Huaavid the pharmacist had questions for Dr. Lucia GaskinsAhern about the prescription and would like a call back.

## 2017-10-20 NOTE — Telephone Encounter (Signed)
She will have to pay for it out of pocket, discussed with the pharmacist thanks

## 2017-10-31 ENCOUNTER — Telehealth: Payer: Self-pay | Admitting: Neurology

## 2017-10-31 NOTE — Telephone Encounter (Signed)
Pt called stating the botox injections "did not work" stating she is in more pain the injection spot is swollen. Pt wanting to inform Dr. Lucia GaskinsAhern that she will be going to have a CT scan due to the pain and will inform her once results are back. FYI

## 2017-10-31 NOTE — Telephone Encounter (Signed)
Noted and Dr. Lucia GaskinsAhern made aware.

## 2018-01-20 ENCOUNTER — Ambulatory Visit: Payer: 59 | Admitting: Neurology

## 2018-01-27 ENCOUNTER — Ambulatory Visit: Payer: 59 | Admitting: Neurology

## 2019-02-01 ENCOUNTER — Other Ambulatory Visit: Payer: Self-pay | Admitting: Orthopedic Surgery

## 2019-02-28 NOTE — Patient Instructions (Signed)
DUE TO COVID-19 ONLY ONE VISITOR IS ALLOWED TO COME WITH YOU AND STAY IN THE WAITING ROOM ONLY DURING PRE OP AND PROCEDURE DAY OF SURGERY. THE 1 VISITOR MAY VISIT WITH YOU AFTER SURGERY IN YOUR PRIVATE ROOM DURING VISITING HOURS ONLY!  YOU NEED TO HAVE A COVID 19 TEST ON__12/3/20_____ @_______ , THIS TEST MUST BE DONE BEFORE SURGERY, COME  Betty Mejia , [id number].  ([place]) ONCE YOUR COVID TEST IS COMPLETED, PLEASE BEGIN THE QUARANTINE INSTRUCTIONS AS OUTLINED IN YOUR HANDOUT.                Betty Mejia   Your procedure is scheduled on: 03/05/19   Report to [facility] Main  Entrance   Report to Short stay at 5:30  AM     Call this number if you have problems the morning of surgery [place], NO CHEWING GUM [place].   Do not eat food After Midnight.   YOU MAY HAVE CLEAR LIQUIDS FROM MIDNIGHT UNTIL 4:30 AM.   At 4:30 AM Please finish the prescribed Pre-Surgery  drink.   Nothing by mouth after you finish the  drink !    Take these medicines the morning of surgery with A SIP OF WATER: Singular, Carbamazepine, Levothyroxine                                 You may not have any metal on your body including hair pins and              piercings  Do not wear jewelry, make-up, lotions, powders or perfumes, deodorant             Do not wear nail polish on your fingernails.  Do not shave  48 hours prior to surgery.             Do not bring valuables to the hospital. [place].  Contacts, dentures or bridgework may not be worn into surgery.       Patients discharged the day of surgery will not be allowed to drive home.   IF YOU ARE HAVING SURGERY AND GOING HOME THE SAME DAY, YOU MUST HAVE AN ADULT TO DRIVE YOU HOME AND BE WITH YOU FOR 24 HOURS.   YOU MAY GO HOME BY TAXI OR UBER OR ORTHERWISE, BUT AN ADULT MUST  ACCOMPANY YOU HOME AND STAY WITH YOU FOR 24 HOURS.  Name and phone number of your driver:  Special Instructions: N/A              Please read over the following fact sheets you were given: _____________________________________________________________________             [facility] - Preparing for Surgery  Before surgery, you can play an important role.   Because skin is not sterile, your skin needs to be as free of germs as possible.   You can reduce the number of germs on your skin by washing with CHG (chlorahexidine gluconate) soap before surgery.   CHG is an antiseptic cleaner which kills germs and bonds with the skin to continue killing germs even after washing. Please DO NOT use if you have an allergy to CHG or antibacterial soaps.  If your skin becomes reddened/irritated stop using the CHG and inform your nurse when you arrive at Short Stay. Do not shave (including legs and underarms) for at least 48 hours prior to the first CHG shower.    Please follow these instructions carefully:  1.  Shower with CHG Soap the night before surgery and the  morning of Surgery.  2.  If you choose to wash your hair, wash your hair first as usual with your  normal  shampoo.  3.  After you shampoo, rinse your hair and body thoroughly to remove the  shampoo.                                        4.  Use CHG as you would any other liquid soap.  You can apply chg directly  to the skin and wash                       Gently with a scrungie or clean washcloth.  5.  Apply the CHG Soap to your body ONLY FROM THE NECK DOWN.   Do not use on face/ open                           Wound or open sores. Avoid contact with eyes, ears mouth and genitals (private parts).                       Wash face,  Genitals (private parts) with your normal soap.             6.  Wash thoroughly, paying special attention to the area where your surgery  will be performed.  7.  Thoroughly rinse your body with warm water from the  neck down.  8.  DO NOT shower/wash with your normal soap after using and rinsing off  the CHG Soap.             9.  Pat yourself dry with a clean towel.            10.  Wear clean pajamas.            11.  Place clean sheets on your bed the night of your first shower and do not  sleep with pets. Day of Surgery : Do not apply any lotions/deodorants the morning of surgery.  Please wear clean clothes to the hospital/surgery center.  FAILURE TO FOLLOW THESE INSTRUCTIONS MAY RESULT IN THE CANCELLATION OF YOUR SURGERY PATIENT SIGNATURE_________________________________  NURSE SIGNATURE__________________________________  ________________________________________________________________________   Betty Mejia  An incentive spirometer is a tool that can help keep your lungs clear and active. This tool measures how well you are filling your lungs with each breath. Taking long deep breaths may help reverse or decrease the chance of developing breathing (pulmonary) problems (especially infection) following:  A long period of time when you are unable to move or be active. BEFORE THE PROCEDURE   If the spirometer includes an indicator to show your best effort, your nurse or respiratory therapist will set it to a desired goal.  If possible, sit up straight or lean slightly forward. Try not to slouch.  Hold the incentive spirometer in an upright position. INSTRUCTIONS FOR USE  1. Sit on the edge of your bed if possible, or sit up as far as  you can in bed or on a chair. 2. Hold the incentive spirometer in an upright position. 3. Breathe out normally. 4. Place the mouthpiece in your mouth and seal your lips tightly around it. 5. Breathe in slowly and as deeply as possible, raising the piston or the ball toward the top of the column. 6. Hold your breath for 3-5 seconds or for as long as possible. Allow the piston or ball to fall to the bottom of the column. 7. Remove the mouthpiece from your  mouth and breathe out normally. 8. Rest for a few seconds and repeat Steps 1 through 7 at least 10 times every 1-2 hours when you are awake. Take your time and take a few normal breaths between deep breaths. 9. The spirometer may include an indicator to show your best effort. Use the indicator as a goal to work toward during each repetition. 10. After each set of 10 deep breaths, practice coughing to be sure your lungs are clear. If you have an incision (the cut made at the time of surgery), support your incision when coughing by placing a pillow or rolled up towels firmly against it. Once you are able to get out of bed, walk around indoors and cough well. You may stop using the incentive spirometer when instructed by your caregiver.  RISKS AND COMPLICATIONS  Take your time so you do not get dizzy or light-headed.  If you are in pain, you may need to take or ask for pain medication before doing incentive spirometry. It is harder to take a deep breath if you are having pain. AFTER USE  Rest and breathe slowly and easily.  It can be helpful to keep track of a log of your progress. Your caregiver can provide you with a simple table to help with this. If you are using the spirometer at home, follow these instructions: SEEK MEDICAL CARE IF:   You are having difficultly using the spirometer.  You have trouble using the spirometer as often as instructed.  Your pain medication is not giving enough relief while using the spirometer.  You develop fever of 100.5 F (38.1 C) or higher. SEEK IMMEDIATE MEDICAL CARE IF:   You cough up bloody sputum that had not been present before.  You develop fever of 102 F (38.9 C) or greater.  You develop worsening pain at or near the incision site. MAKE SURE YOU:   Understand these instructions.  Will watch your condition.  Will get help right away if you are not doing well or get worse. Document Released: 07/26/2006 Document Revised: 06/07/2011  Document Reviewed: 09/26/2006 [facility] Patient Information 2014 [place], [place].   ________________________________________________________________________

## 2019-03-01 ENCOUNTER — Encounter (HOSPITAL_COMMUNITY)
Admission: RE | Admit: 2019-03-01 | Discharge: 2019-03-01 | Disposition: A | Discharge status: Home / Self Care | Payer: 59 | Source: Ambulatory Visit | Attending: Orthopedic Surgery | Admitting: Orthopedic Surgery

## 2019-03-01 ENCOUNTER — Other Ambulatory Visit (HOSPITAL_COMMUNITY): Payer: BLUE CROSS/BLUE SHIELD

## 2019-03-01 ENCOUNTER — Encounter (HOSPITAL_COMMUNITY): Payer: Self-pay

## 2019-03-01 ENCOUNTER — Other Ambulatory Visit: Payer: Self-pay

## 2019-03-01 ENCOUNTER — Other Ambulatory Visit (HOSPITAL_COMMUNITY)
Admission: RE | Admit: 2019-03-01 | Discharge: 2019-03-01 | Disposition: A | Discharge status: Home / Self Care | Payer: 59 | Source: Ambulatory Visit | Attending: Orthopedic Surgery | Admitting: Orthopedic Surgery

## 2019-03-01 DIAGNOSIS — Z01812 Encounter for preprocedural laboratory examination: Secondary | ICD-10-CM | POA: Diagnosis present

## 2019-03-01 DIAGNOSIS — M1612 Unilateral primary osteoarthritis, left hip: Secondary | ICD-10-CM | POA: Diagnosis not present

## 2019-03-01 DIAGNOSIS — Z20828 Contact with and (suspected) exposure to other viral communicable diseases: Secondary | ICD-10-CM | POA: Diagnosis not present

## 2019-03-01 DIAGNOSIS — Z01818 Encounter for other preprocedural examination: Secondary | ICD-10-CM

## 2019-03-01 LAB — URINALYSIS, ROUTINE W REFLEX MICROSCOPIC
Bilirubin Urine: NEGATIVE
Glucose, UA: NEGATIVE mg/dL
Hgb urine dipstick: NEGATIVE
Ketones, ur: NEGATIVE mg/dL
Nitrite: NEGATIVE
Protein, ur: NEGATIVE mg/dL
Specific Gravity, Urine: 1.024 (ref 1.005–1.030)
pH: 5 (ref 5.0–8.0)

## 2019-03-01 LAB — BASIC METABOLIC PANEL
Anion gap: 10 (ref 5–15)
BUN: 14 mg/dL (ref 6–20)
CO2: 27 mmol/L (ref 22–32)
Calcium: 9.3 mg/dL (ref 8.9–10.3)
Chloride: 107 mmol/L (ref 98–111)
Creatinine, Ser: 0.85 mg/dL (ref 0.44–1.00)
GFR calc Af Amer: >60 mL/min (ref >60)
GFR calc non Af Amer: >60 mL/min (ref >60)
Glucose, Bld: 94 mg/dL (ref 70–99)
Potassium: 4.9 mmol/L (ref 3.5–5.1)
Sodium: 144 mmol/L (ref 135–145)

## 2019-03-01 LAB — CBC WITH DIFFERENTIAL/PLATELET
Abs Immature Granulocytes: 0.01 10*3/uL (ref 0.00–0.07)
Basophils Absolute: 0.1 10*3/uL (ref 0.0–0.1)
Basophils Relative: 1 %
Eosinophils Absolute: 0.1 10*3/uL (ref 0.0–0.5)
Eosinophils Relative: 2 %
HCT: 44.6 % (ref 36.0–46.0)
Hemoglobin: 13.9 g/dL (ref 12.0–15.0)
Immature Granulocytes: 0 %
Lymphocytes Relative: 49 %
Lymphs Abs: 3.1 10*3/uL (ref 0.7–4.0)
MCH: 31.6 pg (ref 26.0–34.0)
MCHC: 31.2 g/dL (ref 30.0–36.0)
MCV: 101.4 fL — ABNORMAL HIGH (ref 80.0–100.0)
Monocytes Absolute: 0.4 10*3/uL (ref 0.1–1.0)
Monocytes Relative: 7 %
Neutro Abs: 2.6 10*3/uL (ref 1.7–7.7)
Neutrophils Relative %: 41 %
Platelets: 285 10*3/uL (ref 150–400)
RBC: 4.4 MIL/uL (ref 3.87–5.11)
RDW: 13.6 % (ref 11.5–15.5)
WBC: 6.3 10*3/uL (ref 4.0–10.5)
nRBC: 0 % (ref 0.0–0.2)

## 2019-03-01 LAB — SURGICAL PCR SCREEN
MRSA, PCR: POSITIVE — AB
Staphylococcus aureus: POSITIVE — AB

## 2019-03-01 LAB — ABO/RH: ABO/RH(D): O POS

## 2019-03-01 LAB — APTT: aPTT: 31 seconds (ref 24–36)

## 2019-03-01 LAB — PROTIME-INR
INR: 1 (ref 0.8–1.2)
Prothrombin Time: 13.1 seconds (ref 11.4–15.2)

## 2019-03-01 NOTE — H&P (Signed)
TOTAL HIP ADMISSION H&P  Patient is admitted for left total hip arthroplasty.  Subjective:  Chief Complaint: left hip pain  HPI: Betty Mejia, 58 y.o. female, has a history of pain and functional disability in the left hip(s) due to arthritis and patient has failed non-surgical conservative treatments for greater than 12 weeks to include NSAID's and/or analgesics, corticosteriod injections, use of assistive devices, weight reduction as appropriate and activity modification.  Onset of symptoms was gradual starting 3 years ago with gradually worsening course since that time.The patient noted no past surgery on the left hip(s).  Patient currently rates pain in the left hip at 10 out of 10 with activity. Patient has night pain, worsening of pain with activity and weight bearing, trendelenberg gait, pain that interfers with activities of daily living and pain with passive range of motion. Patient has evidence of subchondral cysts, periarticular osteophytes and joint space narrowing by imaging studies. This condition presents safety issues increasing the risk of falls.   There is no current active infection.  Patient Active Problem List   Diagnosis Date Noted  . Cervical dystonia 11/23/2016  . Numbness and tingling in left hand 11/27/2014  . Primary osteoarthritis of right hip 07/19/2014  . Trigeminal neuralgia 07/06/2013   Past Medical History:  Diagnosis Date  . Anxiety    pt denies  . Arthritis    neck and upper back   . Back pain   . Heart murmur    as a baby  . Hypothyroidism   . Restless leg syndrome   . Thyroid disease   . Trigeminal neuralgia     Past Surgical History:  Procedure Laterality Date  . BACK SURGERY     stimulator in back  x4  . COLONOSCOPY    . SPINAL CORD STIMULATOR INSERTION    . SPINAL CORD STIMULATOR REMOVAL Right 11/21/2014   Procedure: LUMBAR SPINAL CORD STIMULATOR BATTERY REPLACEMENT;  Surgeon: Estill Bamberg, MD;  Location: [facility] OR;  Service: Orthopedics;   Laterality: Right;  Lumbar spinal cord stimulator battery replacement  . TOTAL HIP ARTHROPLASTY Right 07/22/2014   Procedure: TOTAL HIP ARTHROPLASTY;  Surgeon: Gean Birchwood, MD;  Location: [facility] OR;  Service: Orthopedics;  Laterality: Right;  Marland Kitchen VAGINAL HYSTERECTOMY     partial     No current facility-administered medications for this encounter.    Current Outpatient Medications  Medication Sig Dispense Refill Last Dose  . acetaminophen (TYLENOL) 500 MG tablet Take 1,000 mg by mouth every 6 (six) hours as needed for moderate pain or headache.     . Ascorbic Acid (VITAMIN C) 1000 MG tablet Take 1,000 mg by mouth daily.     . carbamazepine (TEGRETOL) 200 MG tablet Take 1 tablet (200 mg total) by mouth 3 (three) times daily. 270 tablet 5   . celecoxib (CELEBREX) 200 MG capsule Take 200 mg by mouth daily.     . Cholecalciferol (VITAMIN D PO) Take 3,000 Units by mouth daily.      . cyclobenzaprine (FLEXERIL) 10 MG tablet Take 10 mg by mouth daily as needed for muscle spasms.   0   . diclofenac Sodium (VOLTAREN) 1 % GEL Apply 1 application topically daily as needed (pain).     Marland Kitchen levothyroxine (SYNTHROID, LEVOTHROID) 112 MCG tablet Take 112 mcg by mouth daily.     . montelukast (SINGULAIR) 10 MG tablet Take 10 mg by mouth daily.     Marland Kitchen oxyCODONE-acetaminophen (PERCOCET) 7.5-325 MG tablet Take 1 tablet by mouth every  8 (eight) hours as needed for moderate pain.   0   . rOPINIRole (REQUIP) 0.25 MG tablet Take 0.25 mg by mouth at bedtime.       Allergies  Allergen Reactions  . Hydrocodone-Acetaminophen Nausea And Vomiting  . Amoxicillin     Did it involve swelling of the face/tongue/throat, SOB, or low BP? No Did it involve sudden or severe rash/hives, skin peeling, or any reaction on the inside of your mouth or nose? No Did you need to seek medical attention at a hospital or doctor's office? Yes When did it last happen?8+years If all above answers are "NO", may proceed with cephalosporin  use. Yeast Infections States she would need Diflucan if she needs this medication.    Social History   Tobacco Use  . Smoking status: Never Smoker  . Smokeless tobacco: Never Used  Substance Use Topics  . Alcohol use: No    Family History  Problem Relation Age of Onset  . Hypertension Mother   . Cancer Father   . Cancer Maternal Grandmother        uterus     Review of Systems  Constitutional: Negative.   HENT: Negative.   Eyes: Negative.   Respiratory: Negative.   Cardiovascular: Negative.   Gastrointestinal: Negative.   Genitourinary: Negative.   Musculoskeletal: Positive for joint pain and myalgias.  Skin: Negative.   Neurological: Negative.   Endo/Heme/Allergies: Negative.   Psychiatric/Behavioral: Negative.     Objective:  Physical Exam  Constitutional: She is oriented to person, place, and time. She appears well-developed and well-nourished.  HENT:  Head: Normocephalic and atraumatic.  Neck: Normal range of motion. Neck supple.  Cardiovascular: Intact distal pulses.  Respiratory: Effort normal.  Musculoskeletal:     Comments: Patient walks with a significant left-sided limp internal rotation of the left hip blocks at 10 and causes severe pain.  Foot tap is negative.  Grossly neurovascular intact distally.  The skin over the left hip is intact with no cuts scrapes or abrasions.  Surgical scar to the right hip which again was a posterior approach is well-healed she has full internal/external rotation on the right side which has an S-ROM prosthesis and Pinnacle shell.   Neurological: She is alert and oriented to person, place, and time.  Skin: Skin is warm and dry.  Psychiatric: She has a normal mood and affect. Her behavior is normal. Judgment and thought content normal.    Vital signs in last 24 hours: BP: ()/()  Arterial Line BP: ()/()   Labs:   Estimated body mass index is 30.29 kg/m as calculated from the following:   Height as of 09/07/17: 5\' 5"   (1.651 m).   Weight as of 09/07/17: 82.6 kg.   Imaging Review Plain radiographs demonstrate AP pelvis and crosstable lateral shows near bone-on-bone arthritic changes with peripheral osteophytes to the left hip again some small superior acetabular cyst.  On the right side the S-ROM stem and Pinnacle shell are in good position with no evidence of loosening or migration.   Assessment/Plan:  End stage arthritis, left hip(s)  The patient history, physical examination, clinical judgement of the provider and imaging studies are consistent with end stage degenerative joint disease of the left hip(s) and total hip arthroplasty is deemed medically necessary. The treatment options including medical management, injection therapy, arthroscopy and arthroplasty were discussed at length. The risks and benefits of total hip arthroplasty were presented and reviewed. The risks due to aseptic loosening, infection, stiffness, dislocation/subluxation,  thromboembolic complications and other imponderables were discussed.  The patient acknowledged the explanation, agreed to proceed with the plan and consent was signed. Patient is being admitted for inpatient treatment for surgery, pain control, PT, OT, prophylactic antibiotics, VTE prophylaxis, progressive ambulation and ADL's and discharge planning.The patient is planning to be discharged home with home health services    Patient's anticipated LOS is less than 2 midnights, meeting these requirements: - Younger than 84 - Lives within 1 hour of care - Has a competent adult at home to recover with post-op recover - NO history of  - Chronic pain requiring opiods  - Diabetes  - Coronary Artery Disease  - Heart failure  - Heart attack  - Stroke  - DVT/VTE  - Cardiac arrhythmia  - Respiratory Failure/COPD  - Renal failure  - Anemia  - Advanced Liver disease

## 2019-03-01 NOTE — Progress Notes (Signed)
PCP - Fawn Kirk PA Cardiologist - none  Chest x-ray -  EKG - 05/09/18 Stress Test - no ECHO - no Cardiac Cath - no  Sleep Study - no CPAP -   Fasting Blood Sugar - NA Checks Blood Sugar _____ times a day  Blood Thinner Instructions:NA Aspirin Instructions: Last Dose:  Anesthesia review:   Patient denies shortness of breath, fever, cough and chest pain at PAT appointment  yes Patient verbalized understanding of instructions that were given to them at the PAT appointment. Patient was also instructed that they will need to review over the PAT instructions again at home before surgery. yes

## 2019-03-04 LAB — NOVEL CORONAVIRUS, NAA (HOSP ORDER, SEND-OUT TO REF LAB; TAT 18-24 HRS): SARS-CoV-2, NAA: NOT DETECTED

## 2019-03-04 MED ORDER — TRANEXAMIC ACID 1000 MG/10ML IV SOLN
2000.0000 mg | INTRAVENOUS | Status: DC
  Filled 2019-03-04: qty 20

## 2019-03-04 MED ORDER — BUPIVACAINE LIPOSOME 1.3 % IJ SUSP
10.0000 mL | Freq: Once | INTRAMUSCULAR | Status: DC
  Filled 2019-03-04: qty 10

## 2019-03-04 NOTE — Anesthesia Preprocedure Evaluation (Signed)
Anesthesia Evaluation  Patient identified by MRN, date of birth, ID band Patient awake    Reviewed: Allergy & Precautions, NPO status , Patient's Chart, lab work & pertinent test results  Airway Mallampati: II  TM Distance: >3 FB Neck ROM: Full    Dental  (+) Teeth Intact, Dental Advisory Given   Pulmonary neg pulmonary ROS,    breath sounds clear to auscultation       Cardiovascular negative cardio ROS   Rhythm:Regular Rate:Normal     Neuro/Psych Anxiety Cervical dystonia Trigeminal neuralgia  Neuromuscular disease negative psych ROS   GI/Hepatic negative GI ROS, Neg liver ROS,   Endo/Other  Hypothyroidism   Renal/GU negative Renal ROS  negative genitourinary   Musculoskeletal  (+) Arthritis , Osteoarthritis,    Abdominal   Peds negative pediatric ROS (+)  Hematology negative hematology ROS (+)   Anesthesia Other Findings   Reproductive/Obstetrics negative OB ROS                             Anesthesia Physical  Anesthesia Plan  ASA: II  Anesthesia Plan: General   Post-op Pain Management:    Induction: Intravenous  PONV Risk Score and Plan: 3 and Ondansetron, Dexamethasone and Treatment may vary due to age or medical condition  Airway Management Planned: Oral ETT  Additional Equipment:   Intra-op Plan:   Post-operative Plan: Extubation in OR  Informed Consent: I have reviewed the patients History and Physical, chart, labs and discussed the procedure including the risks, benefits and alternatives for the proposed anesthesia with the patient or authorized representative who has indicated his/her understanding and acceptance.     Dental advisory given  Plan Discussed with: CRNA, Anesthesiologist and Surgeon  Anesthesia Plan Comments:         Anesthesia Quick Evaluation

## 2019-03-05 ENCOUNTER — Observation Stay (HOSPITAL_COMMUNITY)
Admission: RE | Admit: 2019-03-05 | Discharge: 2019-03-07 | Disposition: A | Discharge status: Home Health | Payer: 59 | Attending: Orthopedic Surgery | Admitting: Orthopedic Surgery

## 2019-03-05 ENCOUNTER — Encounter (HOSPITAL_COMMUNITY): Payer: Self-pay | Admitting: Emergency Medicine

## 2019-03-05 ENCOUNTER — Ambulatory Visit (HOSPITAL_COMMUNITY): Payer: 59 | Admitting: Physician Assistant

## 2019-03-05 ENCOUNTER — Ambulatory Visit (HOSPITAL_COMMUNITY): Payer: 59

## 2019-03-05 ENCOUNTER — Ambulatory Visit (HOSPITAL_COMMUNITY): Payer: 59 | Admitting: Anesthesiology

## 2019-03-05 ENCOUNTER — Other Ambulatory Visit: Payer: Self-pay

## 2019-03-05 ENCOUNTER — Encounter (HOSPITAL_COMMUNITY)
Admission: RE | Disposition: A | Discharge status: Home Health | Payer: Self-pay | Source: Home / Self Care | Attending: Orthopedic Surgery

## 2019-03-05 DIAGNOSIS — Z791 Long term (current) use of non-steroidal anti-inflammatories (NSAID): Secondary | ICD-10-CM | POA: Insufficient documentation

## 2019-03-05 DIAGNOSIS — Z79890 Hormone replacement therapy (postmenopausal): Secondary | ICD-10-CM | POA: Diagnosis not present

## 2019-03-05 DIAGNOSIS — D62 Acute posthemorrhagic anemia: Secondary | ICD-10-CM | POA: Diagnosis not present

## 2019-03-05 DIAGNOSIS — Z96641 Presence of right artificial hip joint: Secondary | ICD-10-CM | POA: Diagnosis not present

## 2019-03-05 DIAGNOSIS — Z880 Allergy status to penicillin: Secondary | ICD-10-CM | POA: Insufficient documentation

## 2019-03-05 DIAGNOSIS — G2581 Restless legs syndrome: Secondary | ICD-10-CM | POA: Diagnosis not present

## 2019-03-05 DIAGNOSIS — Z419 Encounter for procedure for purposes other than remedying health state, unspecified: Secondary | ICD-10-CM

## 2019-03-05 DIAGNOSIS — Z96642 Presence of left artificial hip joint: Secondary | ICD-10-CM

## 2019-03-05 DIAGNOSIS — Z885 Allergy status to narcotic agent status: Secondary | ICD-10-CM | POA: Diagnosis not present

## 2019-03-05 DIAGNOSIS — M1612 Unilateral primary osteoarthritis, left hip: Principal | ICD-10-CM | POA: Insufficient documentation

## 2019-03-05 DIAGNOSIS — Z79899 Other long term (current) drug therapy: Secondary | ICD-10-CM | POA: Diagnosis not present

## 2019-03-05 DIAGNOSIS — E039 Hypothyroidism, unspecified: Secondary | ICD-10-CM | POA: Insufficient documentation

## 2019-03-05 HISTORY — PX: TOTAL HIP ARTHROPLASTY: SHX124

## 2019-03-05 LAB — TYPE AND SCREEN
ABO/RH(D): O POS
Antibody Screen: NEGATIVE

## 2019-03-05 SURGERY — ARTHROPLASTY, HIP, TOTAL, ANTERIOR APPROACH
Anesthesia: General | Site: Hip | Laterality: Left

## 2019-03-05 MED ORDER — ACETAMINOPHEN 325 MG PO TABS: 325.0000 mg | ORAL_TABLET | ORAL | Status: DC | PRN

## 2019-03-05 MED ORDER — SODIUM CHLORIDE 0.45 % IV BOLUS
500.0000 mL | INTRAVENOUS | Status: DC
  Administered 2019-03-06 – 2019-03-07 (×2): 500 mL via INTRAVENOUS

## 2019-03-05 MED ORDER — BUPIVACAINE-EPINEPHRINE 0.25% -1:200000 IJ SOLN
INTRAMUSCULAR | Status: AC
  Filled 2019-03-05: qty 1

## 2019-03-05 MED ORDER — LIDOCAINE 2% (20 MG/ML) 5 ML SYRINGE
INTRAMUSCULAR | Status: DC | PRN
  Administered 2019-03-05: 80 mg via INTRAVENOUS

## 2019-03-05 MED ORDER — PANTOPRAZOLE SODIUM 40 MG PO TBEC
40.0000 mg | DELAYED_RELEASE_TABLET | Freq: Every day | ORAL | Status: DC
  Administered 2019-03-05 – 2019-03-07 (×3): 40 mg via ORAL
  Filled 2019-03-05 (×3): qty 1

## 2019-03-05 MED ORDER — PHENOL 1.4 % MT LIQD: 1.0000 | OROMUCOSAL | Status: DC | PRN

## 2019-03-05 MED ORDER — METHOCARBAMOL 500 MG PO TABS
500.0000 mg | ORAL_TABLET | Freq: Four times a day (QID) | ORAL | Status: DC | PRN
  Administered 2019-03-05 – 2019-03-06 (×2): 500 mg via ORAL
  Filled 2019-03-05 (×2): qty 1

## 2019-03-05 MED ORDER — FENTANYL CITRATE (PF) 100 MCG/2ML IJ SOLN
INTRAMUSCULAR | Status: AC
  Filled 2019-03-05: qty 2

## 2019-03-05 MED ORDER — TRANEXAMIC ACID 1000 MG/10ML IV SOLN
INTRAVENOUS | Status: DC | PRN
  Administered 2019-03-05: 2000 mg via TOPICAL

## 2019-03-05 MED ORDER — BUPIVACAINE LIPOSOME 1.3 % IJ SUSP
INTRAMUSCULAR | Status: DC | PRN
  Administered 2019-03-05: 10 mL

## 2019-03-05 MED ORDER — BUPIVACAINE-EPINEPHRINE 0.25% -1:200000 IJ SOLN
INTRAMUSCULAR | Status: DC | PRN
  Administered 2019-03-05: 30 mL

## 2019-03-05 MED ORDER — ROCURONIUM BROMIDE 10 MG/ML (PF) SYRINGE
PREFILLED_SYRINGE | INTRAVENOUS | Status: DC | PRN
  Administered 2019-03-05: 40 mg via INTRAVENOUS

## 2019-03-05 MED ORDER — PROPOFOL 10 MG/ML IV BOLUS
INTRAVENOUS | Status: DC | PRN
  Administered 2019-03-05: 150 mg via INTRAVENOUS

## 2019-03-05 MED ORDER — DOCUSATE SODIUM 100 MG PO CAPS
100.0000 mg | ORAL_CAPSULE | Freq: Two times a day (BID) | ORAL | Status: DC
  Administered 2019-03-05 – 2019-03-07 (×5): 100 mg via ORAL
  Filled 2019-03-05 (×5): qty 1

## 2019-03-05 MED ORDER — METOCLOPRAMIDE HCL 5 MG PO TABS: 5.0000 mg | ORAL_TABLET | Freq: Three times a day (TID) | ORAL | Status: DC | PRN

## 2019-03-05 MED ORDER — FENTANYL CITRATE (PF) 100 MCG/2ML IJ SOLN
INTRAMUSCULAR | Status: AC
  Administered 2019-03-05: 50 ug via INTRAVENOUS
  Filled 2019-03-05: qty 2

## 2019-03-05 MED ORDER — DIPHENHYDRAMINE HCL 12.5 MG/5ML PO ELIX
12.5000 mg | ORAL_SOLUTION | ORAL | Status: DC | PRN
  Administered 2019-03-05: 25 mg via ORAL
  Filled 2019-03-05: qty 10

## 2019-03-05 MED ORDER — ONDANSETRON HCL 4 MG/2ML IJ SOLN: 4.0000 mg | Freq: Once | INTRAMUSCULAR | Status: DC | PRN

## 2019-03-05 MED ORDER — MEPERIDINE HCL 50 MG/ML IJ SOLN: 6.2500 mg | INTRAMUSCULAR | Status: DC | PRN

## 2019-03-05 MED ORDER — ONDANSETRON HCL 4 MG/2ML IJ SOLN
INTRAMUSCULAR | Status: AC
  Filled 2019-03-05: qty 2

## 2019-03-05 MED ORDER — LIDOCAINE 2% (20 MG/ML) 5 ML SYRINGE
INTRAMUSCULAR | Status: AC
  Filled 2019-03-05: qty 5

## 2019-03-05 MED ORDER — ONDANSETRON HCL 4 MG PO TABS: 4.0000 mg | ORAL_TABLET | Freq: Four times a day (QID) | ORAL | Status: DC | PRN

## 2019-03-05 MED ORDER — OXYCODONE HCL 5 MG PO TABS
5.0000 mg | ORAL_TABLET | ORAL | Status: DC | PRN
  Administered 2019-03-05 – 2019-03-07 (×9): 10 mg via ORAL
  Filled 2019-03-05 (×9): qty 2

## 2019-03-05 MED ORDER — PROPOFOL 10 MG/ML IV BOLUS
INTRAVENOUS | Status: AC
  Filled 2019-03-05: qty 20

## 2019-03-05 MED ORDER — METOCLOPRAMIDE HCL 5 MG/ML IJ SOLN: 5.0000 mg | Freq: Three times a day (TID) | INTRAMUSCULAR | Status: DC | PRN

## 2019-03-05 MED ORDER — OXYCODONE HCL 5 MG/5ML PO SOLN: 5.0000 mg | Freq: Once | ORAL | Status: DC | PRN

## 2019-03-05 MED ORDER — SUCCINYLCHOLINE CHLORIDE 200 MG/10ML IV SOSY
PREFILLED_SYRINGE | INTRAVENOUS | Status: DC | PRN
  Administered 2019-03-05: 100 mg via INTRAVENOUS

## 2019-03-05 MED ORDER — LACTATED RINGERS IV SOLN
INTRAVENOUS | Status: DC
  Administered 2019-03-05 (×2): via INTRAVENOUS

## 2019-03-05 MED ORDER — 0.9 % SODIUM CHLORIDE (POUR BTL) OPTIME
TOPICAL | Status: DC | PRN
  Administered 2019-03-05: 1000 mL

## 2019-03-05 MED ORDER — ACETAMINOPHEN 160 MG/5ML PO SOLN: 325.0000 mg | ORAL | Status: DC | PRN

## 2019-03-05 MED ORDER — ROCURONIUM BROMIDE 10 MG/ML (PF) SYRINGE
PREFILLED_SYRINGE | INTRAVENOUS | Status: AC
  Filled 2019-03-05: qty 10

## 2019-03-05 MED ORDER — CELECOXIB 200 MG PO CAPS
200.0000 mg | ORAL_CAPSULE | Freq: Two times a day (BID) | ORAL | Status: DC
  Administered 2019-03-05 – 2019-03-07 (×4): 200 mg via ORAL
  Filled 2019-03-05 (×4): qty 1

## 2019-03-05 MED ORDER — BISACODYL 5 MG PO TBEC: 5.0000 mg | DELAYED_RELEASE_TABLET | Freq: Every day | ORAL | Status: DC | PRN

## 2019-03-05 MED ORDER — ONDANSETRON HCL 4 MG/2ML IJ SOLN
INTRAMUSCULAR | Status: DC | PRN
  Administered 2019-03-05: 4 mg via INTRAVENOUS

## 2019-03-05 MED ORDER — METHOCARBAMOL 500 MG IVPB - SIMPLE MED
INTRAVENOUS | Status: AC
  Administered 2019-03-05: 500 mg via INTRAVENOUS
  Filled 2019-03-05: qty 50

## 2019-03-05 MED ORDER — ALUM & MAG HYDROXIDE-SIMETH 200-200-20 MG/5ML PO SUSP: 15.0000 mL | ORAL | Status: DC | PRN

## 2019-03-05 MED ORDER — SUCCINYLCHOLINE CHLORIDE 200 MG/10ML IV SOSY
PREFILLED_SYRINGE | INTRAVENOUS | Status: AC
  Filled 2019-03-05: qty 10

## 2019-03-05 MED ORDER — ROPINIROLE HCL 0.5 MG PO TABS
0.2500 mg | ORAL_TABLET | Freq: Every day | ORAL | Status: DC
  Administered 2019-03-05 – 2019-03-06 (×2): 0.25 mg via ORAL
  Filled 2019-03-05 (×2): qty 1

## 2019-03-05 MED ORDER — LEVOTHYROXINE SODIUM 112 MCG PO TABS
112.0000 ug | ORAL_TABLET | Freq: Every day | ORAL | Status: DC
  Administered 2019-03-06 – 2019-03-07 (×2): 112 ug via ORAL
  Filled 2019-03-05 (×2): qty 1

## 2019-03-05 MED ORDER — ASPIRIN 81 MG PO CHEW
81.0000 mg | CHEWABLE_TABLET | Freq: Two times a day (BID) | ORAL | Status: DC
  Administered 2019-03-05 – 2019-03-07 (×4): 81 mg via ORAL
  Filled 2019-03-05 (×4): qty 1

## 2019-03-05 MED ORDER — OXYCODONE-ACETAMINOPHEN 7.5-325 MG PO TABS: 1.0000 | ORAL_TABLET | ORAL | 0 refills | Status: AC | PRN

## 2019-03-05 MED ORDER — OXYCODONE HCL 5 MG PO TABS: 5.0000 mg | ORAL_TABLET | Freq: Once | ORAL | Status: DC | PRN

## 2019-03-05 MED ORDER — MONTELUKAST SODIUM 10 MG PO TABS
10.0000 mg | ORAL_TABLET | Freq: Every day | ORAL | Status: DC
  Administered 2019-03-05 – 2019-03-07 (×3): 10 mg via ORAL
  Filled 2019-03-05 (×3): qty 1

## 2019-03-05 MED ORDER — DEXAMETHASONE SODIUM PHOSPHATE 10 MG/ML IJ SOLN
INTRAMUSCULAR | Status: AC
  Filled 2019-03-05: qty 1

## 2019-03-05 MED ORDER — SODIUM CHLORIDE 0.9% FLUSH
INTRAVENOUS | Status: DC | PRN
  Administered 2019-03-05: 50 mL

## 2019-03-05 MED ORDER — KCL IN DEXTROSE-NACL 20-5-0.45 MEQ/L-%-% IV SOLN
INTRAVENOUS | Status: DC
  Administered 2019-03-05 – 2019-03-07 (×5): via INTRAVENOUS
  Filled 2019-03-05 (×7): qty 1000

## 2019-03-05 MED ORDER — CARBAMAZEPINE 200 MG PO TABS
200.0000 mg | ORAL_TABLET | Freq: Three times a day (TID) | ORAL | Status: DC
  Administered 2019-03-05 – 2019-03-07 (×6): 200 mg via ORAL
  Filled 2019-03-05 (×9): qty 1

## 2019-03-05 MED ORDER — DEXAMETHASONE SODIUM PHOSPHATE 10 MG/ML IJ SOLN
10.0000 mg | Freq: Once | INTRAMUSCULAR | Status: AC
  Administered 2019-03-06: 10 mg via INTRAVENOUS
  Filled 2019-03-05: qty 1

## 2019-03-05 MED ORDER — EPHEDRINE 5 MG/ML INJ
INTRAVENOUS | Status: AC
  Filled 2019-03-05: qty 10

## 2019-03-05 MED ORDER — HYDROMORPHONE HCL 1 MG/ML IJ SOLN
0.5000 mg | INTRAMUSCULAR | Status: DC | PRN
  Administered 2019-03-05 (×3): 1 mg via INTRAVENOUS
  Filled 2019-03-05 (×3): qty 1

## 2019-03-05 MED ORDER — ACETAMINOPHEN 500 MG PO TABS
1000.0000 mg | ORAL_TABLET | Freq: Four times a day (QID) | ORAL | Status: AC
  Administered 2019-03-05 – 2019-03-06 (×3): 1000 mg via ORAL
  Filled 2019-03-05 (×3): qty 2

## 2019-03-05 MED ORDER — POVIDONE-IODINE 10 % EX SWAB
2.0000 "application " | Freq: Once | CUTANEOUS | Status: AC
  Administered 2019-03-05: 2 via TOPICAL

## 2019-03-05 MED ORDER — MIDAZOLAM HCL 5 MG/5ML IJ SOLN
INTRAMUSCULAR | Status: DC | PRN
  Administered 2019-03-05: 2 mg via INTRAVENOUS

## 2019-03-05 MED ORDER — ONDANSETRON HCL 4 MG/2ML IJ SOLN: 4.0000 mg | Freq: Four times a day (QID) | INTRAMUSCULAR | Status: DC | PRN

## 2019-03-05 MED ORDER — FENTANYL CITRATE (PF) 100 MCG/2ML IJ SOLN
25.0000 ug | INTRAMUSCULAR | Status: DC | PRN
  Administered 2019-03-05 (×2): 50 ug via INTRAVENOUS

## 2019-03-05 MED ORDER — PROPOFOL 500 MG/50ML IV EMUL
INTRAVENOUS | Status: AC
  Filled 2019-03-05: qty 50

## 2019-03-05 MED ORDER — PHENYLEPHRINE 40 MCG/ML (10ML) SYRINGE FOR IV PUSH (FOR BLOOD PRESSURE SUPPORT)
PREFILLED_SYRINGE | INTRAVENOUS | Status: DC | PRN
  Administered 2019-03-05 (×4): 80 ug via INTRAVENOUS

## 2019-03-05 MED ORDER — FENTANYL CITRATE (PF) 250 MCG/5ML IJ SOLN
INTRAMUSCULAR | Status: DC | PRN
  Administered 2019-03-05 (×2): 50 ug via INTRAVENOUS
  Administered 2019-03-05: 100 ug via INTRAVENOUS
  Administered 2019-03-05: 50 ug via INTRAVENOUS

## 2019-03-05 MED ORDER — CHLORHEXIDINE GLUCONATE 4 % EX LIQD: 60.0000 mL | Freq: Once | CUTANEOUS | Status: DC

## 2019-03-05 MED ORDER — FENTANYL CITRATE (PF) 250 MCG/5ML IJ SOLN
INTRAMUSCULAR | Status: AC
  Filled 2019-03-05: qty 5

## 2019-03-05 MED ORDER — METHOCARBAMOL 500 MG IVPB - SIMPLE MED
500.0000 mg | Freq: Four times a day (QID) | INTRAVENOUS | Status: DC | PRN
  Administered 2019-03-05: 10:00:00 500 mg via INTRAVENOUS
  Filled 2019-03-05: qty 50

## 2019-03-05 MED ORDER — DEXAMETHASONE SODIUM PHOSPHATE 10 MG/ML IJ SOLN
INTRAMUSCULAR | Status: DC | PRN
  Administered 2019-03-05: 10 mg via INTRAVENOUS

## 2019-03-05 MED ORDER — MIDAZOLAM HCL 2 MG/2ML IJ SOLN
INTRAMUSCULAR | Status: AC
  Filled 2019-03-05: qty 2

## 2019-03-05 MED ORDER — EPHEDRINE SULFATE-NACL 50-0.9 MG/10ML-% IV SOSY
PREFILLED_SYRINGE | INTRAVENOUS | Status: DC | PRN
  Administered 2019-03-05: 10 mg via INTRAVENOUS

## 2019-03-05 MED ORDER — VANCOMYCIN HCL IN DEXTROSE 1-5 GM/200ML-% IV SOLN
1000.0000 mg | INTRAVENOUS | Status: AC
  Administered 2019-03-05 (×2): 1000 mg via INTRAVENOUS
  Filled 2019-03-05: qty 200

## 2019-03-05 MED ORDER — TRANEXAMIC ACID-NACL 1000-0.7 MG/100ML-% IV SOLN
1000.0000 mg | Freq: Once | INTRAVENOUS | Status: AC
  Administered 2019-03-05: 1000 mg via INTRAVENOUS
  Filled 2019-03-05: qty 100

## 2019-03-05 MED ORDER — TRANEXAMIC ACID-NACL 1000-0.7 MG/100ML-% IV SOLN
1000.0000 mg | INTRAVENOUS | Status: AC
  Administered 2019-03-05: 1000 mg via INTRAVENOUS
  Filled 2019-03-05: qty 100

## 2019-03-05 MED ORDER — ASPIRIN EC 81 MG PO TBEC: 81.0000 mg | DELAYED_RELEASE_TABLET | Freq: Two times a day (BID) | ORAL | 0 refills | Status: AC

## 2019-03-05 MED ORDER — GABAPENTIN 100 MG PO CAPS
100.0000 mg | ORAL_CAPSULE | Freq: Three times a day (TID) | ORAL | Status: DC
  Administered 2019-03-05 – 2019-03-07 (×6): 100 mg via ORAL
  Filled 2019-03-05 (×6): qty 1

## 2019-03-05 MED ORDER — MENTHOL 3 MG MT LOZG: 1.0000 | LOZENGE | OROMUCOSAL | Status: DC | PRN

## 2019-03-05 MED ORDER — POLYETHYLENE GLYCOL 3350 17 G PO PACK: 17.0000 g | PACK | Freq: Every day | ORAL | Status: DC | PRN

## 2019-03-05 MED ORDER — SUGAMMADEX SODIUM 200 MG/2ML IV SOLN
INTRAVENOUS | Status: DC | PRN
  Administered 2019-03-05: 100 mg via INTRAVENOUS

## 2019-03-05 MED ORDER — ALUMINUM HYDROXIDE GEL 320 MG/5ML PO SUSP: 15.0000 mL | ORAL | Status: DC | PRN

## 2019-03-05 MED ORDER — FLEET ENEMA 7-19 GM/118ML RE ENEM: 1.0000 | ENEMA | Freq: Once | RECTAL | Status: DC | PRN

## 2019-03-05 MED ORDER — TIZANIDINE HCL 2 MG PO TABS: 2.0000 mg | ORAL_TABLET | Freq: Four times a day (QID) | ORAL | 0 refills | Status: AC | PRN

## 2019-03-05 MED ORDER — ACETAMINOPHEN 325 MG PO TABS: 325.0000 mg | ORAL_TABLET | Freq: Four times a day (QID) | ORAL | Status: DC | PRN

## 2019-03-05 SURGICAL SUPPLY — 45 items
BAG DECANTER FOR FLEXI CONT (MISCELLANEOUS) ×2 IMPLANT
BLADE SAW SGTL 18X1.27X75 (BLADE) ×2 IMPLANT
BLADE SURG SZ10 CARB STEEL (BLADE) ×4 IMPLANT
CONT SPEC 4OZ CLIKSEAL STRL BL (MISCELLANEOUS) ×2 IMPLANT
COVER PERINEAL POST (MISCELLANEOUS) ×2 IMPLANT
COVER SURGICAL LIGHT HANDLE (MISCELLANEOUS) ×2 IMPLANT
COVER WAND RF STERILE (DRAPES) ×2 IMPLANT
CUP ACETBLR 48 OD 100 SERIES (Hips) ×2 IMPLANT
DECANTER SPIKE VIAL GLASS SM (MISCELLANEOUS) ×2 IMPLANT
DRAPE STERI IOBAN 125X83 (DRAPES) ×2 IMPLANT
DRAPE U-SHAPE 47X51 STRL (DRAPES) ×4 IMPLANT
DRSG AQUACEL AG ADV 3.5X10 (GAUZE/BANDAGES/DRESSINGS) ×2 IMPLANT
DURAPREP 26ML APPLICATOR (WOUND CARE) ×2 IMPLANT
ELECT BLADE TIP CTD 4 INCH (ELECTRODE) ×2 IMPLANT
ELECT REM PT RETURN 15FT ADLT (MISCELLANEOUS) ×2 IMPLANT
ELIMINATOR HOLE APEX DEPUY (Hips) ×2 IMPLANT
GLOVE BIO SURGEON STRL SZ7.5 (GLOVE) ×2 IMPLANT
GLOVE BIO SURGEON STRL SZ8.5 (GLOVE) ×2 IMPLANT
GLOVE BIOGEL PI IND STRL 8 (GLOVE) ×1 IMPLANT
GLOVE BIOGEL PI IND STRL 9 (GLOVE) ×1 IMPLANT
GLOVE BIOGEL PI INDICATOR 8 (GLOVE) ×1
GLOVE BIOGEL PI INDICATOR 9 (GLOVE) ×1
GOWN STRL REUS W/TWL XL LVL3 (GOWN DISPOSABLE) ×4 IMPLANT
HEAD FEMORAL 32 CERAMIC (Hips) ×2 IMPLANT
HOLDER FOLEY CATH W/STRAP (MISCELLANEOUS) IMPLANT
KIT TURNOVER KIT A (KITS) ×2 IMPLANT
MANIFOLD NEPTUNE II (INSTRUMENTS) ×2 IMPLANT
NEEDLE HYPO 21X1.5 SAFETY (NEEDLE) ×4 IMPLANT
NS IRRIG 1000ML POUR BTL (IV SOLUTION) ×2 IMPLANT
PACK ANTERIOR HIP CUSTOM (KITS) ×2 IMPLANT
PENCIL SMOKE EVACUATOR (MISCELLANEOUS) ×2 IMPLANT
PINN ALTRX NEUT ID X OD 32X48 ×2 IMPLANT
STEM FEM ACTIS STD SZ4 (Stem) ×2 IMPLANT
SUT ETHIBOND NAB CT1 #1 30IN (SUTURE) ×2 IMPLANT
SUT VIC AB 0 CT1 27 (SUTURE) ×1
SUT VIC AB 0 CT1 27XBRD ANBCTR (SUTURE) ×1 IMPLANT
SUT VIC AB 1 CTX 36 (SUTURE) ×1
SUT VIC AB 1 CTX36XBRD ANBCTR (SUTURE) ×1 IMPLANT
SUT VIC AB 2-0 CT1 27 (SUTURE) ×1
SUT VIC AB 2-0 CT1 TAPERPNT 27 (SUTURE) ×1 IMPLANT
SUT VIC AB 3-0 CT1 27 (SUTURE) ×1
SUT VIC AB 3-0 CT1 TAPERPNT 27 (SUTURE) ×1 IMPLANT
SYR CONTROL 10ML LL (SYRINGE) ×6 IMPLANT
TRAY FOLEY MTR SLVR 16FR STAT (SET/KITS/TRAYS/PACK) IMPLANT
YANKAUER SUCT BULB TIP 10FT TU (MISCELLANEOUS) ×2 IMPLANT

## 2019-03-05 NOTE — Discharge Instructions (Addendum)

## 2019-03-05 NOTE — Discharge Summary (Addendum)
Patient ID: Betty Mejia Burkard MRN: [id number] DOB/AGE: [birth year] 58 y.o.  Admit date: 03/05/2019 Discharge date: 03/07/2019  Admission Diagnoses:  Principal Problem:   Osteoarthritis of left hip Active Problems:   H/O total hip arthroplasty, left   Discharge Diagnoses:  Same  Past Medical History:  Diagnosis Date  . Anxiety    pt denies  . Arthritis    neck and upper back   . Back pain   . Heart murmur    as a baby  . Hypothyroidism   . Restless leg syndrome   . Thyroid disease   . Trigeminal neuralgia     Surgeries: Procedure(s): LEFT TOTAL HIP ARTHROPLASTY ANTERIOR APPROACH on 03/05/2019   Consultants:   Discharged Condition: Improved  Hospital Course: Betty Mejia Counterman is an 58 y.o. female who was admitted 03/05/2019 for operative treatment ofOsteoarthritis of left hip. Patient has severe unremitting pain that affects sleep, daily activities, and work/hobbies. After pre-op clearance the patient was taken to the operating room on 03/05/2019 and underwent  Procedure(s): LEFT TOTAL HIP ARTHROPLASTY ANTERIOR APPROACH.    Patient was given perioperative antibiotics:  Anti-infectives (From admission, onward)   Start     Dose/Rate Route Frequency Ordered Stop   03/05/19 0600  vancomycin (VANCOCIN) IVPB 1000 mg/200 mL premix     1,000 mg 200 mL/hr over 60 Minutes Intravenous On call to O.R. 03/05/19 [id number] 03/05/19 [id number]       Patient was given sequential compression devices, early ambulation, and chemoprophylaxis to prevent DVT.  Patient benefited maximally from hospital stay and there were no complications.    Recent vital signs:  Patient Vitals for the past 24 hrs:  BP Temp Temp src Pulse Resp SpO2  03/07/19 0505 102/67 98.1 F (36.7 C) Oral 75 16 95 %  03/06/19 2214 121/61 98.1 F (36.7 C) Axillary 80 16 95 %  03/06/19 1749 108/63 98.1 F (36.7 C) Oral 77 16 98 %  03/06/19 1556 (!) 104/55 98 F (36.7 C) Oral 87 16 93 %  03/06/19 1114 (!) 97/57 98.8 F (37.1 C)  Oral 78 17 93 %  03/06/19 0943 (!) 80/49 98.5 F (36.9 C) Oral 93 16 94 %  03/06/19 0943 (!) 88/46 - - - - -     Recent laboratory studies:  Recent Labs    03/06/19 0423 03/07/19 0244  WBC 14.1* 12.5*  HGB 9.5* 8.3*  HCT 29.9* 26.9*  PLT 208 189  NA 136  --   K 4.1  --   CL 104  --   CO2 26  --   BUN 11  --   CREATININE 0.94  --   GLUCOSE 129*  --   CALCIUM 7.9*  --      Discharge Medications:   Allergies as of 03/07/2019      Reactions   Hydrocodone-acetaminophen Nausea And Vomiting   Amoxicillin    Did it involve swelling of the face/tongue/throat, SOB, or low BP? No Did it involve sudden or severe rash/hives, skin peeling, or any reaction on the inside of your mouth or nose? No Did you need to seek medical attention at a hospital or doctor's office? Yes When did it last happen?8+years If all above answers are "NO", may proceed with cephalosporin use. Yeast Infections States she would need Diflucan if she needs this medication.      Medication List    STOP taking these medications   acetaminophen 500 MG tablet Commonly known as: TYLENOL   cyclobenzaprine 10  MG tablet Commonly known as: FLEXERIL     TAKE these medications   aspirin EC 81 MG tablet Take 1 tablet (81 mg total) by mouth 2 (two) times daily.   carbamazepine 200 MG tablet Commonly known as: TEGRETOL Take 1 tablet (200 mg total) by mouth 3 (three) times daily.   celecoxib 200 MG capsule Commonly known as: CELEBREX Take 200 mg by mouth daily.   levothyroxine 112 MCG tablet Commonly known as: SYNTHROID Take 112 mcg by mouth daily.   montelukast 10 MG tablet Commonly known as: SINGULAIR Take 10 mg by mouth daily.   oxyCODONE-acetaminophen 7.5-325 MG tablet Commonly known as: PERCOCET Take 1 tablet by mouth every 4 (four) hours as needed for moderate pain. What changed: when to take this   rOPINIRole 0.25 MG tablet Commonly known as: REQUIP Take 0.25 mg by mouth at bedtime.    tiZANidine 2 MG tablet Commonly known as: ZANAFLEX Take 1 tablet (2 mg total) by mouth every 6 (six) hours as needed.   vitamin C 1000 MG tablet Take 1,000 mg by mouth daily.   VITAMIN D PO Take 3,000 Units by mouth daily.   Voltaren 1 % Gel Generic drug: diclofenac Sodium Apply 1 application topically daily as needed (pain).            Durable Medical Equipment  (From admission, onward)         Start     Ordered   03/05/19 1035  DME Walker rolling  Once    Question:  Patient needs a walker to treat with the following condition  Answer:  Status post total hip replacement, left   03/05/19 1034   03/05/19 1035  DME 3 n 1  Once     03/05/19 1034           Discharge Care Instructions  (From admission, onward)         Start     Ordered   03/07/19 0000  Weight bearing as tolerated     03/07/19 0848   03/05/19 0000  Weight bearing as tolerated     03/05/19 1502          Diagnostic Studies: Dg C-arm 1-60 Min-no Report  Result Date: 03/05/2019 Fluoroscopy was utilized by the requesting physician.  No radiographic interpretation.   Dg Hip Operative Unilat W Or W/o Pelvis Left  Result Date: 03/05/2019 CLINICAL DATA:  Left hip replacement EXAM: OPERATIVE LEFT HIP (WITH PELVIS IF PERFORMED) 1 VIEWS TECHNIQUE: Fluoroscopic spot image(s) were submitted for interpretation post-operatively. COMPARISON:  None. FINDINGS: Three intraprocedural fluoroscopic frontal images show in progress and completed left hip arthroplasty without complicating features such as periprosthetic fracture or prosthetic dislocation. IMPRESSION: Fluoroscopy for left hip arthroplasty. Electronically Signed   By: Monte Fantasia M.D.   On: 03/05/2019 08:46    Disposition: Discharge disposition: 01-Home or Self Care       Discharge Instructions    Call MD / Call 911   Complete by: As directed    If you experience chest pain or shortness of breath, CALL 911 and be transported to the hospital  emergency room.  If you develope a fever above 101 F, pus (white drainage) or increased drainage or redness at the wound, or calf pain, call your surgeon's office.   Call MD / Call 911   Complete by: As directed    If you experience chest pain or shortness of breath, CALL 911 and be transported to the hospital emergency room.  If you develope a fever above 101 F, pus (white drainage) or increased drainage or redness at the wound, or calf pain, call your surgeon's office.   Constipation Prevention   Complete by: As directed    Drink plenty of fluids.  Prune juice may be helpful.  You may use a stool softener, such as Colace (over the counter) 100 mg twice a day.  Use MiraLax (over the counter) for constipation as needed.   Constipation Prevention   Complete by: As directed    Drink plenty of fluids.  Prune juice may be helpful.  You may use a stool softener, such as Colace (over the counter) 100 mg twice a day.  Use MiraLax (over the counter) for constipation as needed.   Diet - low sodium heart healthy   Complete by: As directed    Driving restrictions   Complete by: As directed    No driving for 2 weeks   Driving restrictions   Complete by: As directed    No driving for 2 weeks   Increase activity slowly as tolerated   Complete by: As directed    Increase activity slowly as tolerated   Complete by: As directed    Patient may shower   Complete by: As directed    You may shower without a dressing once there is no drainage.  Do not wash over the wound.  If drainage remains, cover wound with plastic wrap and then shower.   Patient may shower   Complete by: As directed    You may shower without a dressing once there is no drainage.  Do not wash over the wound.  If drainage remains, cover wound with plastic wrap and then shower.   Weight bearing as tolerated   Complete by: As directed    Weight bearing as tolerated   Complete by: As directed       Follow-up Information    Gean Birchwood,  MD In 2 weeks.   Specialty: Orthopedic Surgery Contact information: [address] [place] [address] [phone]        [facility]   Specialty: Home Health Services Why: [phone]           Signed: Dannielle Burn 03/07/2019, 8:49 AM

## 2019-03-05 NOTE — Transfer of Care (Signed)
Immediate Anesthesia Transfer of Care Note  Patient: Betty Mejia  Procedure(s) Performed: LEFT TOTAL HIP ARTHROPLASTY ANTERIOR APPROACH (Left Hip)  Patient Location: PACU  Anesthesia Type:General  Level of Consciousness: awake, alert , oriented and patient cooperative  Airway & Oxygen Therapy: Patient Spontanous Breathing and Patient connected to face mask oxygen  Post-op Assessment: Report given to RN, Post -op Vital signs reviewed and stable and Patient moving all extremities  Post vital signs: Reviewed and stable  Last Vitals:  Vitals Value Taken Time  BP 122/72 03/05/19 0920  Temp    Pulse 84 03/05/19 0921  Resp 17 03/05/19 0921  SpO2 100 % 03/05/19 0921  Vitals shown include unvalidated device data.  Last Pain:  Vitals:   03/05/19 0618  TempSrc:   PainSc: 6       Patients Stated Pain Goal: 4 (43/32/95 1884)  Complications: No apparent anesthesia complications

## 2019-03-05 NOTE — Anesthesia Postprocedure Evaluation (Signed)
Anesthesia Post Note  Patient: Betty Mejia  Procedure(s) Performed: LEFT TOTAL HIP ARTHROPLASTY ANTERIOR APPROACH (Left Hip)     Patient location during evaluation: PACU Anesthesia Type: General Level of consciousness: awake and alert Pain management: pain level controlled Vital Signs Assessment: post-procedure vital signs reviewed and stable Respiratory status: spontaneous breathing, nonlabored ventilation, respiratory function stable and patient connected to nasal cannula oxygen Cardiovascular status: blood pressure returned to baseline and stable Postop Assessment: no apparent nausea or vomiting Anesthetic complications: no    Last Vitals:  Vitals:   03/05/19 1130 03/05/19 1228  BP: 113/75 118/70  Pulse: 67 69  Resp: 16 16  Temp: 36.6 C 36.7 C  SpO2: 100% 99%    Last Pain:  Vitals:   03/05/19 1228  TempSrc: Oral  PainSc:                  Jasmin Winberry     

## 2019-03-05 NOTE — Addendum Note (Signed)
Addendum  created 03/05/19 1343 by Janeece Riggers, MD   Clinical Note Signed

## 2019-03-05 NOTE — Anesthesia Postprocedure Evaluation (Signed)
Anesthesia Post Note  Patient: [name]  Procedure(s) Performed: LEFT TOTAL HIP ARTHROPLASTY ANTERIOR APPROACH (Left Hip)     Patient location during evaluation: PACU Anesthesia Type: General Level of consciousness: awake and alert Pain management: pain level controlled Vital Signs Assessment: post-procedure vital signs reviewed and stable Respiratory status: spontaneous breathing, nonlabored ventilation, respiratory function stable and patient connected to nasal cannula oxygen Cardiovascular status: blood pressure returned to baseline and stable Postop Assessment: no apparent nausea or vomiting Anesthetic complications: no    Last Vitals:  Vitals:   03/05/19 1130 03/05/19 1228  BP: 113/75 118/70  Pulse: 67 69  Resp: 16 16  Temp: 36.6 C 36.7 C  SpO2: 100% 99%    Last Pain:  Vitals:   03/05/19 1228  TempSrc: Oral  PainSc:                  Betty Mejia

## 2019-03-05 NOTE — Anesthesia Procedure Notes (Signed)
Procedure Name: Intubation Date/Time: 03/05/2019 7:22 AM Performed by: Mitzie Na, CRNA Pre-anesthesia Checklist: Patient identified, Emergency Drugs available, Suction available and Patient being monitored Patient Re-evaluated:Patient Re-evaluated prior to induction Oxygen Delivery Method: Circle system utilized Preoxygenation: Pre-oxygenation with 100% oxygen Induction Type: IV induction and Rapid sequence Laryngoscope Size: Mac and 3 Grade View: Grade I Tube type: Oral Number of attempts: 1 Airway Equipment and Method: Stylet and Oral airway Placement Confirmation: ETT inserted through vocal cords under direct vision,  positive ETCO2 and breath sounds checked- equal and bilateral Secured at: 23 cm Tube secured with: Tape Dental Injury: Teeth and Oropharynx as per pre-operative assessment

## 2019-03-05 NOTE — Op Note (Signed)
PATIENT ID:      Betty Mejia  MRN:     [id number] DOB/AGE:    1960/08/26 / 58 y.o.  OPERATIVE REPORT   DATE OF PROCEDURE:  03/05/2019      PREOPERATIVE DIAGNOSIS:  LEFT HIP OSTEOARTHRITIS                                                         POSTOPERATIVE DIAGNOSIS:  Same                                                        PROCEDURE: Anterior L total hip arthroplasty using a 48 mm DePuy Pinnacle  Cup, [employer], 0-degree polyethylene liner, a +1 mm x 26mm ceramic head, a 4std Depuy Actis stem  SURGEON: Kerin Salen  ASSISTANT:   Kerry Hough. [employer]  (present throughout entire procedure and necessary for timely completion of the procedure)   ANESTHESIA: Spinal, Exparel 133mg  injection BLOOD LOSS: 400 cc FLUID REPLACEMENT: 1600 cc crystalloid TRANEXAMIC ACID: 1gm IV, 2gm Topical COMPLICATIONS: none    INDICATIONS FOR PROCEDURE: A 58 y.o. year-old With  LEFT HIP OSTEOARTHRITIS   for 3 years, x-rays show bone-on-bone arthritic changes, and osteophytes. Despite conservative measures with observation, anti-inflammatory medicine, narcotics, use of a cane, has severe unremitting pain and can ambulate only a few blocks before resting. Patient desires elective L total hip arthroplasty to decrease pain and increase function. The risks, benefits, and alternatives were discussed at length including but not limited to the risks of infection, bleeding, nerve injury, stiffness, blood clots, the need for revision surgery, cardiopulmonary complications, among others, and they were willing to proceed. Questions answered      PROCEDURE IN DETAIL: The patient was identified by armband,   received preoperative IV antibiotics in the holding area at [facility], taken to the operating room , appropriate anesthetic monitors   were attached and anesthesia was induced with the patient on the gurney. HANA boots were applied to the feet, and the patient  was transferred to the HANA table  with a peroneal post and support underneath the non-operative leg. [name] was then prepped and draped in the usual sterile fashion from just above the iliac crest to the knee. And a timeout procedure was performed. Skin along incision area was injected with 10 cc of Exparel solution. We then made a 13 cm incision along the interval at the leading edge of the tensor fascia lata of starting at 2 cm lateral to the ASIS. Small bleeders in the skin and subcutaneous tissue identified and cauterized we dissected down to the fascia and made an incision in the fascia allowing [place] to elevate the fascia of the tensor muscle and exploited the interval between the rectus and the tensor fascia lata. A Cobra retractor was then placed along the superior neck of the femur. A cerebellar retractor was used to expose the interval between the tensor fascia lata and the rectus femoris.  We identified and cauterized the ascending branch of the anterior circumflex artery. A second Cobra retractor along the inferior neck of the femur. A  small Hohmann retractor was placed underneath the origin of the rectus femoris, giving [place] good medial exposure. Using Ronguers fatty tissue was removed from in front of the anterior capsule. The capsule was then incised, starting out at the superior anterior rim of the acetabulum going laterally along the anterior neck. The capsule was then teed along the neck superiorly and inferiorly. Electrocautery was used to release capsule from the anterior and medial neck of the femur to allow external rotation. Cobra retractors were then placed along the inferior and superior neck allowing [place] to perform a standard neck cut and removed the femoral head with a power corkscrew. We then placed a medium bent homan retractor in the cotyloid notch and posteriorly along the acetabular rim a narrow Cobra retractor. Exposed labral tissue and osteophytes were then removed. We then sequentially reamed up to  a 47 mm basket reamer obtaining good coverage in all quadrants, verified by C-arm imaging. Under C-arm control we then hammered into place a 48 mm Pinnacle cup in 45 of abduction and 15 of anteversion. The cup seated nicely and required no supplemental screws. We then placed a central hole Eliminator and a 0 polyethylene liner. The foot was then externally rotated to 130-140. The limb was extended and adducted to the floor, delivering the proximal femur up into the wound. A medium curved Hohmann retractor was placed over the greater trochanter and a long Homan retractor along the posterior femoral neck completing the exposure and lateralizing the femur. We then performed releases superiorly and and inferiorly of the capsule going back to the pirformis fossa superiorly and to the lesser trochanter inferiorly. We then entered the proximal femur with the box cutting offset chisel followed by, a canal sounder, the chili pepper and broaching up to a 4 broach. This seated nicely and we reamed the calcar. A trial reduction was performed with a 1 mm X 32 mm head.The limb lengths were excellent the hip was stable in 90 of external rotation. At this point the trial components removed and we hammered into place a # 4std  Offset Actis stem with Gryption coating. A + 1 mm x 32 std head was then hammered into place. The hip was reduced and final C-arm images obtained. The wound was thoroughly irrigated with normal saline solution. We repaired the ant capsule and the tensor fascia lot a with running 0 vicryl suture. the subcutaneous tissue was closed with 2-0 and 3-0 Vicryl suture followed by an Aquacil dressing. At this point the patient was awaken and transferred to hospital gurney without difficulty.   Nestor Lewandowsky 03/05/2019, 7:03 AM

## 2019-03-05 NOTE — Evaluation (Signed)
Physical Therapy Evaluation Patient Details Name: Betty Mejia MRN: [id number] DOB: 12-22-1960 Today's Date: 03/05/2019   History of Present Illness  Patient is 58 y.o. female s/p Lt THA anterior approach on 03/05/19 with PMH significant for hypothyroidism, back pain, OA, Rt THA, and spinal cord stimulator.    Clinical Impression  Betty Mejia is a 58 y.o. female POD 0 s/p Lt THA anterior approach. Patient reports modified independence with SPC for mobility at baseline. Patient is now limited by functional impairments (see PT problem list below) and requires min-mod assist for transfers and gait with RW. Patient was able to ambulate ~12 feet with RW and was limited greatly by lt anterior hip/groin pain. Patient instructed in exercise to facilitate ROM and circulation. Patient will benefit from continued skilled PT interventions to address impairments and progress towards PLOF. Acute PT will follow to progress mobility and stair training in preparation for safe discharge home.    Follow Up Recommendations Follow surgeon's recommendation for DC plan and follow-up therapies    Equipment Recommendations  Rolling walker with 5" wheels;3in1 (PT)    Recommendations for Other Services       Precautions / Restrictions Precautions Precautions: Fall Restrictions Weight Bearing Restrictions: No      Mobility  Bed Mobility Overal bed mobility: Needs Assistance Bed Mobility: Supine to Sit     Supine to sit: HOB elevated;Mod assist     General bed mobility comments: pt limited greatly by pain, required cues for sequencing and assist to bring bil LE's to EOB  Transfers Overall transfer level: Needs assistance Equipment used: Rolling walker (2 wheeled) Transfers: Sit to/from Stand Sit to Stand: Min assist;Mod assist;From elevated surface         General transfer comment: cues for hand placement and technique form EOB and BSC, min-mod assist to initiate power  up  Ambulation/Gait Ambulation/Gait assistance: Min assist Gait Distance (Feet): 12 Feet Assistive device: Rolling walker (2 wheeled) Gait Pattern/deviations: Antalgic;Decreased step length - left;Decreased stance time - left;Decreased stride length;Decreased weight shift to left Gait velocity: decreased   General Gait Details: pt greatly limited by pain and requried verbacl cues for safe sequencing of step pattern and to weight shift to Rt to unweight her Lt LE. min assist required to steady with RW.  Stairs            Wheelchair Mobility    Modified Rankin (Stroke Patients Only)       Balance Overall balance assessment: Needs assistance Sitting-balance support: Feet supported Sitting balance-Leahy Scale: Good     Standing balance support: During functional activity;Bilateral upper extremity supported Standing balance-Leahy Scale: Poor              Pertinent Vitals/Pain Pain Assessment: Faces Faces Pain Scale: Hurts whole lot Pain Location: Lt hip (groin) Pain Descriptors / Indicators: Aching;Burning Pain Intervention(s): Limited activity within patient's tolerance;Monitored during session;Ice applied;Repositioned    Home Living Family/patient expects to be discharged to:: Private residence Living Arrangements: Spouse/significant other Available Help at Discharge: Family;Available PRN/intermittently(pt's husband took a few days off from work and her daughter can help some) Type of Home: House Home Access: Stairs to enter Entrance Stairs-Rails: Right;Left;Can reach both Technical brewer of Steps: 3 Home Layout: One level Home Equipment: [place] - single point      Prior Function Level of Independence: Independent with assistive device(s)         Comments: pt uses SPC for mobility     Hand Dominance   Dominant  Hand: Right    Extremity/Trunk Assessment   Upper Extremity Assessment Upper Extremity Assessment: Overall WFL for tasks assessed     Lower Extremity Assessment Lower Extremity Assessment: Generalized weakness;LLE deficits/detail LLE: Unable to fully assess due to pain LLE Sensation: WNL LLE Coordination: WNL    Cervical / Trunk Assessment Cervical / Trunk Assessment: Normal  Communication   Communication: No difficulties  Cognition Arousal/Alertness: Awake/alert Behavior During Therapy: WFL for tasks assessed/performed Overall Cognitive Status: Within Functional Limits for tasks assessed           General Comments      Exercises Total Joint Exercises Ankle Circles/Pumps: AROM;10 reps;Seated;Both Quad Sets: AROM;10 reps;Seated;Left Heel Slides: AAROM;5 reps;Seated;Left   Assessment/Plan    PT Assessment Patient needs continued PT services  PT Problem List Decreased strength;Decreased balance;Decreased mobility;Decreased range of motion;Decreased activity tolerance;Decreased knowledge of use of DME       PT Treatment Interventions DME instruction;Functional mobility training;Balance training;Patient/family education;Modalities;Gait training;Therapeutic activities;Therapeutic exercise;Stair training    PT Goals (Current goals can be found in the Care Plan section)  Acute Rehab PT Goals Patient Stated Goal: get moving more independent PT Goal Formulation: With patient Time For Goal Achievement: 03/12/19 Potential to Achieve Goals: Good    Frequency 7X/week    AM-PAC PT "6 Clicks" Mobility  Outcome Measure Help needed turning from your back to your side while in a flat bed without using bedrails?: A Little Help needed moving from lying on your back to sitting on the side of a flat bed without using bedrails?: A Little Help needed moving to and from a bed to a chair (including a wheelchair)?: A Little Help needed standing up from a chair using your arms (e.g., wheelchair or bedside chair)?: A Lot Help needed to walk in hospital room?: A Little Help needed climbing 3-5 steps with a railing? : A  Lot 6 Click Score: 16    End of Session Equipment Utilized During Treatment: Gait belt Activity Tolerance: Patient tolerated treatment well Patient left: with call bell/phone within reach;in chair;with chair alarm set;with family/visitor present Nurse Communication: Mobility status PT Visit Diagnosis: Muscle weakness (generalized) (M62.81);Difficulty in walking, not elsewhere classified (R26.2)    Time: 0263-7858 PT Time Calculation (min) (ACUTE ONLY): 37 min   Charges:   PT Evaluation $PT Eval Low Complexity: 1 Low PT Treatments $Therapeutic Exercise: 8-22 mins        Valentino Saxon, PT, DPT Physical Therapist with [facility]  03/05/2019 3:43 PM

## 2019-03-05 NOTE — Interval H&P Note (Signed)
History and Physical Interval Note:  03/05/2019 7:02 AM  Betty Mejia  has presented today for surgery, with the diagnosis of LEFT HIP OSTEOARTHRITIS.  The various methods of treatment have been discussed with the patient and family. After consideration of risks, benefits and other options for treatment, the patient has consented to  Procedure(s): LEFT TOTAL HIP ARTHROPLASTY ANTERIOR APPROACH (Left) as a surgical intervention.  The patient's history has been reviewed, patient examined, no change in status, stable for surgery.  I have reviewed the patient's chart and labs.  Questions were answered to the patient's satisfaction.     Kerin Salen

## 2019-03-05 NOTE — Brief Op Note (Signed)
PATIENT ID:      Betty Mejia  MRN:     [id number] DOB/AGE:    Nov 07, 1960 / 58 y.o.  OPERATIVE REPORT   DATE OF PROCEDURE:  03/05/2019      PREOPERATIVE DIAGNOSIS:  LEFT HIP OSTEOARTHRITIS                                                         POSTOPERATIVE DIAGNOSIS:  LEFT HIP OSTEOARTHRITIS                                                          PROCEDURE: Anterior L total hip arthroplasty using a 48 mm DePuy Pinnacle  Cup, [employer], 0-degree polyethylene liner, a +48 mm x 64mm 32 ceramic head, a 4 std Depuy Actis stem  SURGEON: Nestor Lewandowsky  ASSISTANT:   Tomi Likens. [employer]  (present throughout entire procedure and necessary for timely completion of the procedure)   ANESTHESIA: Spinal, Exparel 133mg  injection BLOOD LOSS: 400 cc FLUID REPLACEMENT: 1500 cc crystalloid TRANEXAMIC ACID: 1gm IV, 2gm Topical COMPLICATIONS: none    INDICATIONS FOR PROCEDURE: A 58 y.o. year-old With  LEFT HIP OSTEOARTHRITIS   for 3 years, x-rays show bone-on-bone arthritic changes, and osteophytes. Despite conservative measures with observation, anti-inflammatory medicine, narcotics, use of a cane, has severe unremitting pain and can ambulate only a few blocks before resting. Patient desires elective L total hip arthroplasty to decrease pain and increase function. The risks, benefits, and alternatives were discussed at length including but not limited to the risks of infection, bleeding, nerve injury, stiffness, blood clots, the need for revision surgery, cardiopulmonary complications, among others, and they were willing to proceed. Questions answered      PROCEDURE IN DETAIL: The patient was identified by armband,   received preoperative IV antibiotics in the holding area at [facility], taken to the operating room , appropriate anesthetic monitors   were attached and anesthesia was induced with the patient on the gurney. HANA boots were applied to the feet, and the patient  was  transferred to the HANA table with a peroneal post and support underneath the non-operative leg. [name] was then prepped and draped in the usual sterile fashion from just above the iliac crest to the knee. And a timeout procedure was performed. Skin along incision area was injected with 10 cc of Exparel solution. We then made a 14 cm incision along the interval at the leading edge of the tensor fascia lata of starting at 2 cm lateral to the ASIS. Small bleeders in the skin and subcutaneous tissue identified and cauterized we dissected down to the fascia and made an incision in the fascia allowing [facility] to elevate the fascia of the tensor muscle and exploited the interval between the rectus and the tensor fascia lata. A Cobra retractor was then placed along the superior neck of the femur. A cerebellar retractor was used to expose the interval between the tensor fascia lata and the rectus femoris.  We identified and cauterized the ascending branch of the anterior circumflex artery. A second Cobra retractor along the  inferior neck of the femur. A small Hohmann retractor was placed underneath the origin of the rectus femoris, giving [place] good medial exposure. Using Ronguers fatty tissue was removed from in front of the anterior capsule. The capsule was then incised, starting out at the superior anterior rim of the acetabulum going laterally along the anterior neck. The capsule was then teed along the neck superiorly and inferiorly. Electrocautery was used to release capsule from the anterior and medial neck of the femur to allow external rotation. Cobra retractors were then placed along the inferior and superior neck allowing [place] to perform a standard neck cut and removed the femoral head with a power corkscrew. We then placed a medium bent homan retractor in the cotyloid notch and posteriorly along the acetabular rim a narrow Cobra retractor. Exposed labral tissue and osteophytes were then removed. We  then sequentially reamed up to a 47 mm basket reamer obtaining good coverage in all quadrants, verified by C-arm imaging. Under C-arm control we then hammered into place a 48 mm Pinnacle cup in 45 of abduction and 15 of anteversion. The cup seated nicely and required no supplemental screws. We then placed a central hole Eliminator and a 0 polyethylene liner. The foot was then externally rotated to 130-140. The limb was extended and adducted to the floor, delivering the proximal femur up into the wound. A medium curved Hohmann retractor was placed over the greater trochanter and a long Homan retractor along the posterior femoral neck completing the exposure and lateralizing the femur. We then performed releases superiorly and and inferiorly of the capsule going back to the pirformis fossa superiorly and to the lesser trochanter inferiorly. We then entered the proximal femur with the box cutting offset chisel followed by, a canal sounder, the chili pepper and broaching up to a 4 broach. This seated nicely and we reamed the calcar. A trial reduction was performed with a 1 mm X 32 mm head.The limb lengths were excellent the hip was stable in 90 of external rotation. At this point the trial components removed and we hammered into place a # 4 std  Offset Actis stem with Gryption coating. A + 1 mm x 32 ceramic head was then hammered into place. The hip was reduced and final C-arm images obtained. The wound was thoroughly irrigated with normal saline solution. We repaired the ant capsule and the tensor fascia lot a with running 0 vicryl suture. the subcutaneous tissue was closed with 2-0 and 3-0 Vicryl suture followed by an Aquacil dressing. At this point the patient was awaken and transferred to hospital gurney without difficulty.   Kerin Salen 03/05/2019, 8:30 AM

## 2019-03-06 ENCOUNTER — Encounter (HOSPITAL_COMMUNITY): Payer: Self-pay | Admitting: Orthopedic Surgery

## 2019-03-06 DIAGNOSIS — M1612 Unilateral primary osteoarthritis, left hip: Secondary | ICD-10-CM | POA: Diagnosis not present

## 2019-03-06 LAB — CBC
HCT: 29.9 % — ABNORMAL LOW (ref 36.0–46.0)
Hemoglobin: 9.5 g/dL — ABNORMAL LOW (ref 12.0–15.0)
MCH: 32.2 pg (ref 26.0–34.0)
MCHC: 31.8 g/dL (ref 30.0–36.0)
MCV: 101.4 fL — ABNORMAL HIGH (ref 80.0–100.0)
Platelets: 208 10*3/uL (ref 150–400)
RBC: 2.95 MIL/uL — ABNORMAL LOW (ref 3.87–5.11)
RDW: 13.7 % (ref 11.5–15.5)
WBC: 14.1 10*3/uL — ABNORMAL HIGH (ref 4.0–10.5)
nRBC: 0 % (ref 0.0–0.2)

## 2019-03-06 LAB — BASIC METABOLIC PANEL
Anion gap: 6 (ref 5–15)
BUN: 11 mg/dL (ref 6–20)
CO2: 26 mmol/L (ref 22–32)
Calcium: 7.9 mg/dL — ABNORMAL LOW (ref 8.9–10.3)
Chloride: 104 mmol/L (ref 98–111)
Creatinine, Ser: 0.94 mg/dL (ref 0.44–1.00)
GFR calc Af Amer: >60 mL/min (ref >60)
GFR calc non Af Amer: >60 mL/min (ref >60)
Glucose, Bld: 129 mg/dL — ABNORMAL HIGH (ref 70–99)
Potassium: 4.1 mmol/L (ref 3.5–5.1)
Sodium: 136 mmol/L (ref 135–145)

## 2019-03-06 NOTE — TOC Progression Note (Addendum)
Transition of Care [facility]) - Progression Note    Patient Details  Name: ALLETTA MATTOS MRN: [id number] Date of Birth: Sep 30, 1960  Transition of Care [facility]) CM/SW [place], [place] Phone Number: 03/06/2019, 11:55 AM  Clinical Narrative:    [facility]  unable to accept patient for PT services are out of demographic region. CSW met with the patient at bedside to discuss [place]. CSW gave the patient a list of Medicare.gov Home health agencies. Patient reports she used [facility] in the past and prefers to use them again. CSW reached out to Rep. Santiago Glad to confirm staff availability.  Patient ordered RW through [place].     Expected Discharge Plan: [place] Barriers to Discharge: No Barriers Identified  Expected Discharge Plan and Services Expected Discharge Plan: [place]         Expected Discharge Date: 03/05/19               DME Arranged: Gilford Rile rolling DME Agency: [employer] Date DME Agency Contacted: 03/06/19 Time DME Agency Contacted: [id number] Representative spoke with at DME Agency: Ovid Curd [place]: PT [place]: [place] ([place]) Date [place]: 03/06/19 Time [place]: 1950 Representative spoke with at [place]: [place] ([place]) Interventions    Readmission Risk Interventions No flowsheet data found.

## 2019-03-06 NOTE — Progress Notes (Signed)
PATIENT ID: Betty Mejia  MRN: [id number]  DOB/AGE:  07/14/1960 / 58 y.o.  1 Day Post-Op Procedure(s) (LRB): LEFT TOTAL HIP ARTHROPLASTY ANTERIOR APPROACH (Left)    PROGRESS NOTE Subjective: Patient is alert, oriented, no Nausea, no Vomiting, yes passing gas, . Taking PO well. Denies SOB, Chest or Calf Pain. Using Incentive Spirometer, PAS in place. Ambulate 82' Patient reports pain as  5/10  .    Objective: Vital signs in last 24 hours: Vitals:   03/05/19 1755 03/05/19 2159 03/06/19 0203 03/06/19 0514  BP: 134/76 121/65 (Abnormal) 102/52 94/61  Pulse: 78 91 92 86  Resp: 17 15 18 18   Temp: 98 F (36.7 C) (Abnormal) 100.4 F (38 C) 99.6 F (37.6 C) 98.9 F (37.2 C)  TempSrc: Oral Oral Oral Oral  SpO2: 98% 96% 95% 100%  Weight:      Height:          Intake/Output from previous day: I/O last 3 completed shifts: In: 3861.1 [P.O.:440; I.V.:3071.1; IV Piggyback:350] Out: 2800 [Urine:2400; Blood:400]   Intake/Output this shift: No intake/output data recorded.   LABORATORY DATA: Recent Labs    03/06/19 0423  WBC 14.1*  HGB 9.5*  HCT 29.9*  PLT 208  NA 136  K 4.1  CL 104  CO2 26  BUN 11  CREATININE 0.94  GLUCOSE 129*  CALCIUM 7.9*    Examination: Neurologically intact ABD soft Neurovascular intact Sensation intact distally Intact pulses distally Dorsiflexion/Plantar flexion intact Incision: dressing C/D/I No cellulitis present Compartment soft} XR AP&Lat of hip shows well placed\fixed THA  Assessment:   1 Day Post-Op Procedure(s) (LRB): LEFT TOTAL HIP ARTHROPLASTY ANTERIOR APPROACH (Left) ADDITIONAL DIAGNOSIS:  Expected Acute Blood Loss Anemia,   Patient's anticipated LOS is less than 2 midnights, meeting these requirements: - Younger than 69 - Lives within 1 hour of care - Has a competent adult at home to recover with post-op recover - NO history of  - Chronic pain requiring opiods  - Diabetes  - Coronary Artery Disease  - Heart failure  -  Heart attack  - Stroke  - DVT/VTE  - Cardiac arrhythmia  - Respiratory Failure/COPD  - Renal failure  - Anemia  - Advanced Liver disease       Plan: PT/OT WBAT, THA  DVT Prophylaxis: SCDx72 hrs, ASA 81 mg BID x 2 weeks  DISCHARGE PLAN: Home, today when pt passes PT.  DISCHARGE NEEDS: HHPT, Walker and 3-in-1 comode seat   Patient ID: Betty Mejia, female   DOB: [birth year], 58 y.o.   MRN: [id number]

## 2019-03-06 NOTE — Progress Notes (Signed)
Physical Therapy Treatment Patient Details Name: Betty Mejia MRN: [id number] DOB: [birth year] Today's Date: 03/06/2019    History of Present Illness Patient is 58 y.o. female s/p Lt THA anterior approach on 03/05/19 with PMH significant for hypothyroidism, back pain, OA, Rt THA, and spinal cord stimulator.    PT Comments    Progressing with mobility. Improved activity tolerance and performance. Pt does not feel comfortable discharging home on today-RN aware. Will continue to progress activity as tolerated. Plan is for d/c home on tomorrow if all continues to go well.     Follow Up Recommendations  Follow surgeon's recommendation for DC plan and follow-up therapies     Equipment Recommendations  Rolling walker with 5" wheels;3in1 (PT)    Recommendations for Other Services       Precautions / Restrictions Precautions Precautions: Fall Restrictions Weight Bearing Restrictions: No Other Position/Activity Restrictions: WBAT    Mobility  Bed Mobility Overal bed mobility: Needs Assistance Bed Mobility: Supine to Sit     Supine to sit: Min guard;HOB elevated     General bed mobility comments: increased time. no assist given.  Transfers Overall transfer level: Needs assistance Equipment used: Rolling walker (2 wheeled) Transfers: Sit to/from Stand Sit to Stand: Min guard         General transfer comment: Close guard for safety. VCs safety, hand placement.  Ambulation/Gait Ambulation/Gait assistance: Min guard Gait Distance (Feet): 100 Feet Assistive device: Rolling walker (2 wheeled) Gait Pattern/deviations: Step-to pattern;Decreased stride length;Decreased step length - left;Decreased dorsiflexion - left     General Gait Details: Very slow gait speed. Cues for safety, increased DF, and longer L step length to avoid L LE lagging behine. Followed with recliner.   Stairs Stairs: Yes Stairs assistance: Min guard Stair Management: Step to pattern;Forwards;Two  rails Number of Stairs: 4 General stair comments: VCs safety, technique, sequence. Close guard for safety.   Wheelchair Mobility    Modified Rankin (Stroke Patients Only)       Balance Overall balance assessment: Needs assistance         Standing balance support: Bilateral upper extremity supported Standing balance-Leahy Scale: Poor                              Cognition Arousal/Alertness: Awake/alert Behavior During Therapy: WFL for tasks assessed/performed Overall Cognitive Status: Within Functional Limits for tasks assessed                                        Exercises Total Joint Exercises Ankle Circles/Pumps: AROM;Both;10 reps;Seated Quad Sets: AROM;Both;10 reps;Seated Heel Slides: AAROM;Left;10 reps;Seated Hip ABduction/ADduction: AAROM;Left;10 reps;Seated    General Comments        Pertinent Vitals/Pain Pain Assessment: 0-10 Pain Score: 6  Pain Location: L groin, anterior and lateral hip/thigh Pain Descriptors / Indicators: Burning;Sharp;Aching;Tightness Pain Intervention(s): Monitored during session;Ice applied;Repositioned    Home Living                      Prior Function            PT Goals (current goals can now be found in the care plan section) Progress towards PT goals: Progressing toward goals    Frequency    7X/week      PT Plan Current plan remains appropriate    Co-evaluation  AM-PAC PT "6 Clicks" Mobility   Outcome Measure  Help needed turning from your back to your side while in a flat bed without using bedrails?: A Little Help needed moving from lying on your back to sitting on the side of a flat bed without using bedrails?: A Little Help needed moving to and from a bed to a chair (including a wheelchair)?: A Little Help needed standing up from a chair using your arms (e.g., wheelchair or bedside chair)?: A Little Help needed to walk in hospital room?: A  Little Help needed climbing 3-5 steps with a railing? : A Little 6 Click Score: 18    End of Session Equipment Utilized During Treatment: Gait belt Activity Tolerance: Patient limited by pain Patient left: in chair;with call bell/phone within reach;with family/visitor present   PT Visit Diagnosis: Pain;Other abnormalities of gait and mobility (R26.89) Pain - Right/Left: Left Pain - part of body: Hip     Time: 1420-1446 PT Time Calculation (min) (ACUTE ONLY): 26 min  Charges:  $Gait Training: 23-37 mins $Therapeutic Exercise: 8-22 mins                        Weston Anna, PT Acute Rehabilitation Services Pager: [phone] Office: [phone]

## 2019-03-06 NOTE — Progress Notes (Signed)
Physical Therapy Treatment Patient Details Name: Betty Mejia MRN: [id number] DOB: [birth year] Today's Date: 03/06/2019    History of Present Illness Patient is 58 y.o. female s/p Lt THA anterior approach on 03/05/19 with PMH significant for hypothyroidism, back pain, OA, Rt THA, and spinal cord stimulator.    PT Comments    Progressing slowly with mobility. Pt tearful during session. She required Max encouragement for progression of activity. Explained to pt that the more she does the better it will likely feel. Will plan to have a 2nd session to continue to gait training and hopefully stair training. Unsure if pt will meet PT goals at this time.     Follow Up Recommendations  Follow surgeon's recommendation for DC plan and follow-up therapies     Equipment Recommendations  Rolling walker with 5" wheels;3in1 (PT)    Recommendations for Other Services       Precautions / Restrictions Precautions Precautions: Fall Restrictions Weight Bearing Restrictions: No Other Position/Activity Restrictions: WBAT    Mobility  Bed Mobility               General bed mobility comments: oob in recliner  Transfers Overall transfer level: Needs assistance Equipment used: Rolling walker (2 wheeled) Transfers: Sit to/from Stand Sit to Stand: Min assist         General transfer comment: VCs safety, technique, hand placement. Increased time.  Ambulation/Gait Ambulation/Gait assistance: Min assist Gait Distance (Feet): 20 Feet Assistive device: Rolling walker (2 wheeled)       General Gait Details: Very slow gait speed. Pt has difficulty advancing L LE. Max encouragement for progression of ambulation distance but pt was resistant and tearful.   Stairs             Wheelchair Mobility    Modified Rankin (Stroke Patients Only)       Balance Overall balance assessment: Needs assistance         Standing balance support: Bilateral upper extremity  supported Standing balance-Leahy Scale: Poor                              Cognition Arousal/Alertness: Awake/alert Behavior During Therapy: WFL for tasks assessed/performed Overall Cognitive Status: Within Functional Limits for tasks assessed                                        Exercises Total Joint Exercises Ankle Circles/Pumps: AROM;Both;10 reps;Seated Quad Sets: AROM;Both;10 reps;Seated Heel Slides: AAROM;Left;10 reps;Seated Hip ABduction/ADduction: AAROM;Left;10 reps;Seated    General Comments        Pertinent Vitals/Pain Pain Assessment: 0-10 Pain Score: 8  Pain Location: L groing, anterior and lateral hip/thigh Pain Descriptors / Indicators: Burning;Sharp;Crying;Grimacing Pain Intervention(s): Limited activity within patient's tolerance;Monitored during session;Ice applied    Home Living                      Prior Function            PT Goals (current goals can now be found in the care plan section) Progress towards PT goals: Progressing toward goals    Frequency    7X/week      PT Plan Current plan remains appropriate    Co-evaluation              AM-PAC PT "6 Clicks" Mobility   Outcome Measure  Help needed turning from your back to your side while in a flat bed without using bedrails?: A Little Help needed moving from lying on your back to sitting on the side of a flat bed without using bedrails?: A Little Help needed moving to and from a bed to a chair (including a wheelchair)?: A Little Help needed standing up from a chair using your arms (e.g., wheelchair or bedside chair)?: A Little Help needed to walk in hospital room?: A Little Help needed climbing 3-5 steps with a railing? : A Little 6 Click Score: 18    End of Session Equipment Utilized During Treatment: Gait belt Activity Tolerance: Patient limited by pain Patient left: in chair;with call bell/phone within reach   PT Visit Diagnosis: Other  abnormalities of gait and mobility (R26.89);Pain Pain - Right/Left: Left Pain - part of body: Hip     Time: 4098-1191 PT Time Calculation (min) (ACUTE ONLY): 31 min  Charges:  $Gait Training: 8-22 mins $Therapeutic Exercise: 8-22 mins             Rebeca Alert, PT Acute Rehabilitation Services Pager: [phone] Office: [phone]

## 2019-03-07 DIAGNOSIS — M1612 Unilateral primary osteoarthritis, left hip: Secondary | ICD-10-CM | POA: Diagnosis not present

## 2019-03-07 LAB — CBC
HCT: 26.9 % — ABNORMAL LOW (ref 36.0–46.0)
Hemoglobin: 8.3 g/dL — ABNORMAL LOW (ref 12.0–15.0)
MCH: 32 pg (ref 26.0–34.0)
MCHC: 30.9 g/dL (ref 30.0–36.0)
MCV: 103.9 fL — ABNORMAL HIGH (ref 80.0–100.0)
Platelets: 189 10*3/uL (ref 150–400)
RBC: 2.59 MIL/uL — ABNORMAL LOW (ref 3.87–5.11)
RDW: 14.2 % (ref 11.5–15.5)
WBC: 12.5 10*3/uL — ABNORMAL HIGH (ref 4.0–10.5)
nRBC: 0 % (ref 0.0–0.2)

## 2019-03-07 NOTE — Progress Notes (Signed)
PATIENT ID: Betty Mejia  MRN: [id number]  DOB/AGE:  05-06-1960 / 58 y.o.  2 Days Post-Op Procedure(s) (LRB): LEFT TOTAL HIP ARTHROPLASTY ANTERIOR APPROACH (Left)    PROGRESS NOTE Subjective: Patient is alert, oriented, no Nausea, no Vomiting, yes passing gas, . Taking PO well. Denies SOB, Chest or Calf Pain. Using Incentive Spirometer, PAS in place. Ambulate WBAT with pt walking 100 ft with therapy. Patient reports pain as  mild .    Objective: Vital signs in last 24 hours: Vitals:   03/06/19 1556 03/06/19 1749 03/06/19 2214 03/07/19 0505  BP: (!) 104/55 108/63 121/61 102/67  Pulse: 87 77 80 75  Resp: 16 16 16 16   Temp: 98 F (36.7 C) 98.1 F (36.7 C) 98.1 F (36.7 C) 98.1 F (36.7 C)  TempSrc: Oral Oral Axillary Oral  SpO2: 93% 98% 95% 95%  Weight:      Height:          Intake/Output from previous day: I/O last 3 completed shifts: In: 6090.8 [P.O.:1649; I.V.:3941.8; IV Piggyback:500] Out: 800 [Urine:800]   Intake/Output this shift: No intake/output data recorded.   LABORATORY DATA: Recent Labs    03/06/19 0423 03/07/19 0244  WBC 14.1* 12.5*  HGB 9.5* 8.3*  HCT 29.9* 26.9*  PLT 208 189  NA 136  --   K 4.1  --   CL 104  --   CO2 26  --   BUN 11  --   CREATININE 0.94  --   GLUCOSE 129*  --   CALCIUM 7.9*  --     Examination: Neurologically intact Neurovascular intact Sensation intact distally Intact pulses distally Dorsiflexion/Plantar flexion intact Incision: dressing C/D/I and no drainage No cellulitis present Compartment soft} XR AP&Lat of hip shows well placed\fixed THA  Assessment:   2 Days Post-Op Procedure(s) (LRB): LEFT TOTAL HIP ARTHROPLASTY ANTERIOR APPROACH (Left) ADDITIONAL DIAGNOSIS:  Expected Acute Blood Loss Anemia,   Patient's anticipated LOS is less than 2 midnights, meeting these requirements: - Younger than 96 - Lives within 1 hour of care - Has a competent adult at home to recover with post-op recover - NO history of  -  Chronic pain requiring opiods  - Diabetes  - Coronary Artery Disease  - Heart failure  - Heart attack  - Stroke  - DVT/VTE  - Cardiac arrhythmia  - Respiratory Failure/COPD  - Renal failure  - Anemia  - Advanced Liver disease       Plan: PT/OT WBAT, THA  DVT Prophylaxis: SCDx72 hrs, ASA 81 mg BID x 2 weeks  DISCHARGE PLAN: Home  DISCHARGE NEEDS: HHPT, Walker and 3-in-1 comode seat

## 2019-03-07 NOTE — Progress Notes (Signed)
Physical Therapy Treatment Patient Details Name: Betty Mejia MRN: [id number] DOB: Jun 26, 1960 Today's Date: 03/07/2019    History of Present Illness Patient is 58 y.o. female s/p Lt THA anterior approach on 03/05/19 with PMH significant for hypothyroidism, back pain, OA, Rt THA, and spinal cord stimulator.    PT Comments    Progressing with mobility. Reviewed/practiced exercises and gait training. All education completed. Okay to d/c from PT standpoint.    Follow Up Recommendations  Follow surgeon's recommendation for DC plan and follow-up therapies     Equipment Recommendations  Rolling walker with 5" wheels;3in1 (PT)    Recommendations for Other Services       Precautions / Restrictions Precautions Precautions: Fall Restrictions Weight Bearing Restrictions: No LLE Weight Bearing: Weight bearing as tolerated    Mobility  Bed Mobility Overal bed mobility: Needs Assistance Bed Mobility: Supine to Sit     Supine to sit: Min guard;HOB elevated     General bed mobility comments: increased time. no assist given.  Transfers Overall transfer level: Needs assistance Equipment used: Rolling walker (2 wheeled) Transfers: Sit to/from Stand Sit to Stand: Min guard         General transfer comment: Close guard for safety. VCs safety, hand placement.  Ambulation/Gait Ambulation/Gait assistance: Min guard Gait Distance (Feet): 125 Feet Assistive device: Rolling walker (2 wheeled) Gait Pattern/deviations: Step-to pattern     General Gait Details: Slow gait speed. Pt tolerated distance well. Pt deneid dizziness/lightheadedness   Stairs Stairs: (pt practiced stairs on 12/8. she did not feel she needed to practice again on today.)           Wheelchair Mobility    Modified Rankin (Stroke Patients Only)       Balance                                            Cognition Arousal/Alertness: Awake/alert Behavior During Therapy: WFL for  tasks assessed/performed Overall Cognitive Status: Within Functional Limits for tasks assessed                                        Exercises Total Joint Exercises Ankle Circles/Pumps: AROM;Both;10 reps;Seated Quad Sets: AROM;Both;10 reps;Seated Heel Slides: AAROM;Left;10 reps;Seated Hip ABduction/ADduction: AAROM;Left;10 reps;Seated    General Comments        Pertinent Vitals/Pain Pain Assessment: 0-10 Pain Score: 5  Pain Location: L groin, anterior and lateral hip/thigh Pain Descriptors / Indicators: Discomfort;Sore;Tightness Pain Intervention(s): Monitored during session;Ice applied    Home Living                      Prior Function            PT Goals (current goals can now be found in the care plan section) Progress towards PT goals: Progressing toward goals    Frequency    7X/week      PT Plan Current plan remains appropriate    Co-evaluation              AM-PAC PT "6 Clicks" Mobility   Outcome Measure  Help needed turning from your back to your side while in a flat bed without using bedrails?: A Little Help needed moving from lying on your back to sitting on the side of  a flat bed without using bedrails?: A Little Help needed moving to and from a bed to a chair (including a wheelchair)?: A Little Help needed standing up from a chair using your arms (e.g., wheelchair or bedside chair)?: A Little Help needed to walk in hospital room?: A Little Help needed climbing 3-5 steps with a railing? : A Little 6 Click Score: 18    End of Session Equipment Utilized During Treatment: Gait belt Activity Tolerance: Patient tolerated treatment well Patient left: in chair;with call bell/phone within reach   PT Visit Diagnosis: Pain;Other abnormalities of gait and mobility (R26.89) Pain - Right/Left: Right Pain - part of body: Hip     Time: 1030-1047 PT Time Calculation (min) (ACUTE ONLY): 17 min  Charges:  $Gait Training: 8-22  mins                        Weston Anna, [place] Pager: [phone] Office: [phone]

## 2019-03-07 NOTE — Progress Notes (Signed)
Pt provided with d/c instructions. After discussing the pt's plan of care upon d/c home, the pt reported no further questions or concerns.  

## 2022-11-10 ENCOUNTER — Other Ambulatory Visit: Payer: Self-pay | Admitting: Physician Assistant

## 2022-11-10 DIAGNOSIS — G959 Disease of spinal cord, unspecified: Secondary | ICD-10-CM

## 2022-11-10 DIAGNOSIS — M5416 Radiculopathy, lumbar region: Secondary | ICD-10-CM

## 2022-11-10 DIAGNOSIS — M549 Dorsalgia, unspecified: Secondary | ICD-10-CM

## 2022-11-10 DIAGNOSIS — M5412 Radiculopathy, cervical region: Secondary | ICD-10-CM

## 2022-11-30 ENCOUNTER — Other Ambulatory Visit: Payer: Self-pay | Admitting: Student in an Organized Health Care Education/Training Program

## 2022-11-30 DIAGNOSIS — M5416 Radiculopathy, lumbar region: Secondary | ICD-10-CM

## 2022-11-30 DIAGNOSIS — M5412 Radiculopathy, cervical region: Secondary | ICD-10-CM

## 2022-11-30 DIAGNOSIS — M549 Dorsalgia, unspecified: Secondary | ICD-10-CM

## 2022-12-09 NOTE — Discharge Instructions (Signed)

## 2022-12-10 ENCOUNTER — Ambulatory Visit
Admission: RE | Admit: 2022-12-10 | Discharge: 2022-12-10 | Disposition: A | Discharge status: Home / Self Care | Payer: 59 | Source: Ambulatory Visit | Attending: Student in an Organized Health Care Education/Training Program

## 2022-12-10 ENCOUNTER — Ambulatory Visit
Admission: RE | Admit: 2022-12-10 | Discharge: 2022-12-10 | Disposition: A | Discharge status: Home / Self Care | Payer: 59 | Source: Ambulatory Visit | Attending: Student in an Organized Health Care Education/Training Program | Admitting: Student in an Organized Health Care Education/Training Program

## 2022-12-10 DIAGNOSIS — M549 Dorsalgia, unspecified: Secondary | ICD-10-CM

## 2022-12-10 DIAGNOSIS — M5412 Radiculopathy, cervical region: Secondary | ICD-10-CM

## 2022-12-10 DIAGNOSIS — M5416 Radiculopathy, lumbar region: Secondary | ICD-10-CM

## 2022-12-10 MED ORDER — DIAZEPAM 5 MG PO TABS
10.0000 mg | ORAL_TABLET | Freq: Once | ORAL | Status: AC
  Administered 2022-12-10: 10 mg via ORAL

## 2022-12-10 MED ORDER — MEPERIDINE HCL 50 MG/ML IJ SOLN
50.0000 mg | Freq: Once | INTRAMUSCULAR | Status: AC | PRN
  Administered 2022-12-10: 50 mg via INTRAMUSCULAR

## 2022-12-10 MED ORDER — ONDANSETRON HCL 4 MG/2ML IJ SOLN
4.0000 mg | Freq: Once | INTRAMUSCULAR | Status: AC | PRN
  Administered 2022-12-10: 4 mg via INTRAMUSCULAR

## 2022-12-10 MED ORDER — IOPAMIDOL (ISOVUE-M 300) INJECTION 61%
10.0000 mL | Freq: Once | INTRAMUSCULAR | Status: AC
  Administered 2022-12-10: 10 mL via INTRATHECAL

## 2022-12-10 NOTE — Discharge Instr - Other Info (Addendum)
10:52: pt reports pain 10/10 in lower back from myelogram procedure. See MAR 1140: pt reports relief in pain 0/10. Pt in nursing recovery area until time for d/c

## 2022-12-11 ENCOUNTER — Emergency Department (HOSPITAL_BASED_OUTPATIENT_CLINIC_OR_DEPARTMENT_OTHER): Payer: 59

## 2022-12-11 ENCOUNTER — Other Ambulatory Visit: Payer: Self-pay

## 2022-12-11 ENCOUNTER — Emergency Department (HOSPITAL_BASED_OUTPATIENT_CLINIC_OR_DEPARTMENT_OTHER)
Admission: EM | Admit: 2022-12-11 | Discharge: 2022-12-11 | Disposition: A | Discharge status: Home / Self Care | Payer: 59 | Attending: Emergency Medicine | Admitting: Emergency Medicine

## 2022-12-11 DIAGNOSIS — R55 Syncope and collapse: Secondary | ICD-10-CM | POA: Diagnosis present

## 2022-12-11 DIAGNOSIS — Z7982 Long term (current) use of aspirin: Secondary | ICD-10-CM | POA: Insufficient documentation

## 2022-12-11 DIAGNOSIS — R42 Dizziness and giddiness: Secondary | ICD-10-CM

## 2022-12-11 DIAGNOSIS — I959 Hypotension, unspecified: Secondary | ICD-10-CM | POA: Diagnosis not present

## 2022-12-11 LAB — MAGNESIUM: Magnesium: 2.1 mg/dL (ref 1.7–2.4)

## 2022-12-11 LAB — CBC
HCT: 38.8 % (ref 36.0–46.0)
Hemoglobin: 12.5 g/dL (ref 12.0–15.0)
MCH: 32 pg (ref 26.0–34.0)
MCHC: 32.2 g/dL (ref 30.0–36.0)
MCV: 99.2 fL (ref 80.0–100.0)
Platelets: 290 10*3/uL (ref 150–400)
RBC: 3.91 MIL/uL (ref 3.87–5.11)
RDW: 13.8 % (ref 11.5–15.5)
WBC: 7.3 10*3/uL (ref 4.0–10.5)
nRBC: 0 % (ref 0.0–0.2)

## 2022-12-11 LAB — BASIC METABOLIC PANEL
Anion gap: 8 (ref 5–15)
BUN: 21 mg/dL (ref 8–23)
CO2: 28 mmol/L (ref 22–32)
Calcium: 8.9 mg/dL (ref 8.9–10.3)
Chloride: 105 mmol/L (ref 98–111)
Creatinine, Ser: 0.98 mg/dL (ref 0.44–1.00)
GFR, Estimated: >60 mL/min (ref >60)
Glucose, Bld: 83 mg/dL (ref 70–99)
Potassium: 4.2 mmol/L (ref 3.5–5.1)
Sodium: 141 mmol/L (ref 135–145)

## 2022-12-11 LAB — TROPONIN I (HIGH SENSITIVITY): Troponin I (High Sensitivity): <2 ng/L (ref <18)

## 2022-12-11 LAB — D-DIMER, QUANTITATIVE: D-Dimer, Quant: 0.29 ug{FEU}/mL (ref 0.00–0.50)

## 2022-12-11 MED ORDER — LACTATED RINGERS IV BOLUS
1000.0000 mL | Freq: Once | INTRAVENOUS | Status: AC
  Administered 2022-12-11: 1000 mL via INTRAVENOUS

## 2022-12-11 NOTE — ED Triage Notes (Signed)
Patient presents to ED via POV. Here with dizziness. Reports checking her BP and it was 80/56. Normotensive here.

## 2022-12-11 NOTE — Discharge Instructions (Signed)
You were seen for your dizziness in the emergency department.  You were given IV fluids.  At home, please stay well-hydrated.    Check your MyChart online for the results of any tests that had not resulted by the time you left the emergency department.   Follow-up with your primary doctor in 2-3 days regarding your visit.    Return immediately to the emergency department if you experience any of the following: Chest pain, shortness of breath, fainting, or any other concerning symptoms.    Thank you for visiting our Emergency Department. It was a pleasure taking care of you today.

## 2022-12-11 NOTE — ED Provider Notes (Signed)
[place] [facility] AT [facility] Provider Note   CSN: [id number] Arrival date & time: 12/11/22  1135     History  Chief Complaint  Patient presents with   Hypotension    Betty Mejia is a 62 y.o. female.  62 year old female with a history of chronic back pain who presents emergency department with presyncope.  Patient reports that she was walking around in a football game when she started feeling flushed and lightheaded.  She checked her blood pressure and it was 85/56.  Says that at that time also had some chest discomfort.  Describes it as a sharp stabbing sensation in the left side of her chest.  Says that she now feels back to baseline.  Did have a myelogram yesterday and received Demerol and Valium and thinks that it may be related to those medications.  Says she has been taking adequate p.o. intake recently.       Home Medications Prior to Admission medications   Medication Sig Start Date End Date Taking? Authorizing Provider  Ascorbic Acid (VITAMIN C) 1000 MG tablet Take 1,000 mg by mouth daily.    [provider]  aspirin EC 81 MG tablet Take 1 tablet (81 mg total) by mouth 2 (two) times daily. 03/05/19   Allena Katz, PA-C  carbamazepine (TEGRETOL) 200 MG tablet Take 1 tablet (200 mg total) by mouth 3 (three) times daily. 11/24/15   Anson Fret, MD  celecoxib (CELEBREX) 200 MG capsule Take 200 mg by mouth daily.    [provider]  Cholecalciferol (VITAMIN D PO) Take 3,000 Units by mouth daily.     [provider]  diclofenac Sodium (VOLTAREN) 1 % GEL Apply 1 application topically daily as needed (pain).    [provider]  levothyroxine (SYNTHROID, LEVOTHROID) 112 MCG tablet Take 112 mcg by mouth daily. 09/05/17   [provider]  montelukast (SINGULAIR) 10 MG tablet Take 10 mg by mouth daily. 09/05/17   [provider]  oxyCODONE-acetaminophen (PERCOCET) 7.5-325 MG tablet Take 1 tablet  by mouth every 4 (four) hours as needed for moderate pain. 03/05/19   Allena Katz, PA-C  rOPINIRole (REQUIP) 0.25 MG tablet Take 0.25 mg by mouth at bedtime.     [provider]  tiZANidine (ZANAFLEX) 2 MG tablet Take 1 tablet (2 mg total) by mouth every 6 (six) hours as needed. 03/05/19   Allena Katz, PA-C      Allergies    Hydrocodone-acetaminophen and Amoxicillin    Review of Systems   Review of Systems  Physical Exam Updated Vital Signs BP 125/63   Pulse 87   Temp 98.2 F (36.8 C) (Oral)   Resp (!) 24   Ht 5\' 5"  (1.651 m)   Wt 77.1 kg   SpO2 100%   BMI 28.29 kg/m  Physical Exam Vitals and nursing note reviewed.  Constitutional:      General: She is not in acute distress.    Appearance: She is well-developed.  HENT:     Head: Normocephalic and atraumatic.     Right Ear: External ear normal.     Left Ear: External ear normal.     Nose: Nose normal.  Eyes:     Extraocular Movements: Extraocular movements intact.     Conjunctiva/sclera: Conjunctivae normal.     Pupils: Pupils are equal, round, and reactive to light.  Cardiovascular:     Rate and Rhythm: Normal rate and regular rhythm.  Heart sounds: No murmur heard.    Comments: Radial pulse 2+ bilaterally Pulmonary:     Effort: Pulmonary effort is normal. No respiratory distress.     Breath sounds: Normal breath sounds.  Musculoskeletal:     Cervical back: Normal range of motion and neck supple.     Right lower leg: No edema.     Left lower leg: No edema.  Skin:    General: Skin is warm and dry.  Neurological:     Mental Status: She is alert and oriented to person, place, and time. Mental status is at baseline.  Psychiatric:        Mood and Affect: Mood normal.     ED Results / Procedures / Treatments   Labs (all labs ordered are listed, but only abnormal results are displayed) Labs Reviewed  BASIC METABOLIC PANEL  CBC  MAGNESIUM  D-DIMER, QUANTITATIVE  TROPONIN I (HIGH  SENSITIVITY)    EKG EKG Interpretation Date/Time:  Saturday December 11 2022 11:49:09 EDT Ventricular Rate:  90 PR Interval:  133 QRS Duration:  71 QT Interval:  348 QTC Calculation: 426 R Axis:   43  Text Interpretation: Sinus rhythm Confirmed by Vonita Moss [phone]) on 12/11/2022 12:27:12 PM  Radiology DG Chest 2 View  Result Date: 12/11/2022 CLINICAL DATA:  Dizziness.  Low blood pressure.  Presyncope. EXAM: CHEST - 2 VIEW COMPARISON:  07/11/2014 FINDINGS: Heart size is normal. Lung volumes are low. No pleural fluid or edema. No airspace opacities identified. Dorsal column stimulator is identified which terminates to the mid to lower thoracic spine. IMPRESSION: 1. Low lung volumes. 2. No acute findings. Electronically Signed   By: Signa Kell M.D.   On: 12/11/2022 12:16   DG MYELOGRAPHY LUMBAR INJ MULTI REGION  Result Date: 12/10/2022 CLINICAL DATA:  Lumbar radiculopathy. Bilateral lower extremity pain in a left S1 distribution and right L5 distribution. Left upper extremity pain radiating to all fingers. FLUOROSCOPY: Radiation Exposure Index (as provided by the fluoroscopic device): Dose area product 441.25 PROCEDURE: LUMBAR PUNCTURE FOR CERVICAL AND LUMBAR MYELOGRAM CERVICAL AND LUMBAR MYELOGRAM CT CERVICAL MYELOGRAM CT LUMBAR MYELOGRAM After thorough discussion of risks and benefits of the procedure including bleeding, infection, injury to nerves, blood vessels, adjacent structures as well as headache and CSF leak, written and oral informed consent was obtained. Consent was obtained by Dr. Marin Roberts. Patient was positioned prone on the fluoroscopy table. Local anesthesia was provided with 1% lidocaine without epinephrine after prepped and draped in the usual sterile fashion. Puncture was performed at L3-4 using a 3 1/2 inch 22-gauge spinal needle via left paramedian approach. Using a single pass through the dura, the needle was placed within the thecal sac, with return of  clear CSF. 10 mL of Isovue M-300 was injected into the thecal sac, with normal opacification of the nerve roots and cauda equina consistent with free flow within the subarachnoid space. The patient was then moved to the trendelenburg position and contrast flowed into the Cervical spine region. TECHNIQUE: Contiguous axial images were obtained through the Cervical and Lumbar spine after the intrathecal infusion of infusion. Coronal and sagittal reconstructions were obtained of the axial image sets. COMPARISON:  Total myelogram 05/22/2013 FINDINGS: CERVICAL AND LUMBAR MYELOGRAM FINDINGS: Mild straightening of the normal cervical lordosis is present. Slight retrolisthesis is present at C6-7 with bilateral uncovertebral spurring impacting the central canal and foramina. Slight anterolisthesis is present at L4-5. This is exaggerated with standing. Slight retrolisthesis is present at L1-2 which partially reduces  an flexion. Nerve roots fill normally in the lumbar spine. Spinal cord stimulator is noted. CT CERVICAL MYELOGRAM FINDINGS: In cervical spine is imaged from the skull base to the T2-3 level. Vertebral body heights normal. Slight retrolisthesis is present at C6-7. Craniocervical junction is within normal limits. The visualized intracranial contents are normal. The soft tissues of the neck are unremarkable. The lung apices are clear. The thoracic inlet is within normal limits. C2-3: A shallow central disc protrusion is again noted without significant stenosis or change. C3-4: A mild broad-based disc osteophyte complex is present. Uncovertebral spurring has progressed on the left. Moderate left foraminal narrowing is new. C4-5: A mild broad-based disc osteophyte complex partially effaces the ventral CSF. Mild uncovertebral spurring is worse on the right. No focal stenosis is present. C5-6: A broad-based disc osteophyte complex is present. Uncovertebral spurring present bilaterally. No significant stenosis or change is  present. C6-7: Broad-based disc osteophyte complex is asymmetric to the right. Moderate to severe right and moderate left foraminal stenosis is present. CT LUMBAR MYELOGRAM FINDINGS: 5 non rib-bearing lumbar type vertebral bodies are present. The lowest fully formed vertebral body is L5. Slight anterolisthesis is present at L4-5. No other significant listhesis is present. Straightening of the normal lumbar lordosis is present. Conus medullaris terminates at T12-L1. Limited imaging the abdomen is unremarkable. There is no significant adenopathy. No solid organ lesions are present. Cord stimulator enters the spinal canal at T10-11. T12-L1: Mild facet hypertrophy is present. No significant stenosis or change is present. L1-2: Mild disc bulging and facet hypertrophy are present. Facet hypertrophy is progressed slightly. No significant stenosis is present. L2-3: Mild disc bulging and facet hypertrophy are present. No significant stenosis is present. L3-4: Mild facet hypertrophy is progressed. No significant disc protrusion or stenosis is present. L4-5: Progressive moderate facet hypertrophy is present. Right laminectomy is noted. A mild broad-based disc bulge is present. Moderate bilateral facet hypertrophy has progressed. Mild left subarticular and moderate left foraminal stenosis is present. Mild right foraminal is present. L5-S1: Chronic loss of disc height again noted. Facet hypertrophy has progressed foraminal narrowing bilaterally is stable. IMPRESSION: 1. Progressive moderate to severe right and moderate left foraminal stenosis at C6-7 secondary to a broad-based disc osteophyte complex asymmetric to the right. 2. Progressive uncovertebral spurring on the left at C3-4 with moderate left foraminal narrowing. 3. Stable broad-based disc osteophyte complex and bilateral uncovertebral spurring at C5-6 without significant stenosis. 4. Progressive facet hypertrophy at L1-2 and L3-4 without significant stenosis. 5.  Progressive facet hypertrophy at L4-5 with mild left subarticular and moderate left foraminal stenosis. 6. Mild right foraminal stenosis at L4-5. 7. Progressive facet hypertrophy at L5-S1 with progressive mild foraminal narrowing bilaterally. 8. Spinal cord stimulator enters the spinal canal at T10-11. Electronically Signed   By: Marin Roberts M.D.   On: 12/10/2022 16:25   CT LUMBAR SPINE W CONTRAST  Result Date: 12/10/2022 CLINICAL DATA:  Lumbar radiculopathy. Bilateral lower extremity pain in a left S1 distribution and right L5 distribution. Left upper extremity pain radiating to all fingers. FLUOROSCOPY: Radiation Exposure Index (as provided by the fluoroscopic device): Dose area product 441.25 PROCEDURE: LUMBAR PUNCTURE FOR CERVICAL AND LUMBAR MYELOGRAM CERVICAL AND LUMBAR MYELOGRAM CT CERVICAL MYELOGRAM CT LUMBAR MYELOGRAM After thorough discussion of risks and benefits of the procedure including bleeding, infection, injury to nerves, blood vessels, adjacent structures as well as headache and CSF leak, written and oral informed consent was obtained. Consent was obtained by Dr. Marin Roberts. Patient was positioned  prone on the fluoroscopy table. Local anesthesia was provided with 1% lidocaine without epinephrine after prepped and draped in the usual sterile fashion. Puncture was performed at L3-4 using a 3 1/2 inch 22-gauge spinal needle via left paramedian approach. Using a single pass through the dura, the needle was placed within the thecal sac, with return of clear CSF. 10 mL of Isovue M-300 was injected into the thecal sac, with normal opacification of the nerve roots and cauda equina consistent with free flow within the subarachnoid space. The patient was then moved to the trendelenburg position and contrast flowed into the Cervical spine region. TECHNIQUE: Contiguous axial images were obtained through the Cervical and Lumbar spine after the intrathecal infusion of infusion. Coronal and  sagittal reconstructions were obtained of the axial image sets. COMPARISON:  Total myelogram 05/22/2013 FINDINGS: CERVICAL AND LUMBAR MYELOGRAM FINDINGS: Mild straightening of the normal cervical lordosis is present. Slight retrolisthesis is present at C6-7 with bilateral uncovertebral spurring impacting the central canal and foramina. Slight anterolisthesis is present at L4-5. This is exaggerated with standing. Slight retrolisthesis is present at L1-2 which partially reduces an flexion. Nerve roots fill normally in the lumbar spine. Spinal cord stimulator is noted. CT CERVICAL MYELOGRAM FINDINGS: In cervical spine is imaged from the skull base to the T2-3 level. Vertebral body heights normal. Slight retrolisthesis is present at C6-7. Craniocervical junction is within normal limits. The visualized intracranial contents are normal. The soft tissues of the neck are unremarkable. The lung apices are clear. The thoracic inlet is within normal limits. C2-3: A shallow central disc protrusion is again noted without significant stenosis or change. C3-4: A mild broad-based disc osteophyte complex is present. Uncovertebral spurring has progressed on the left. Moderate left foraminal narrowing is new. C4-5: A mild broad-based disc osteophyte complex partially effaces the ventral CSF. Mild uncovertebral spurring is worse on the right. No focal stenosis is present. C5-6: A broad-based disc osteophyte complex is present. Uncovertebral spurring present bilaterally. No significant stenosis or change is present. C6-7: Broad-based disc osteophyte complex is asymmetric to the right. Moderate to severe right and moderate left foraminal stenosis is present. CT LUMBAR MYELOGRAM FINDINGS: 5 non rib-bearing lumbar type vertebral bodies are present. The lowest fully formed vertebral body is L5. Slight anterolisthesis is present at L4-5. No other significant listhesis is present. Straightening of the normal lumbar lordosis is present. Conus  medullaris terminates at T12-L1. Limited imaging the abdomen is unremarkable. There is no significant adenopathy. No solid organ lesions are present. Cord stimulator enters the spinal canal at T10-11. T12-L1: Mild facet hypertrophy is present. No significant stenosis or change is present. L1-2: Mild disc bulging and facet hypertrophy are present. Facet hypertrophy is progressed slightly. No significant stenosis is present. L2-3: Mild disc bulging and facet hypertrophy are present. No significant stenosis is present. L3-4: Mild facet hypertrophy is progressed. No significant disc protrusion or stenosis is present. L4-5: Progressive moderate facet hypertrophy is present. Right laminectomy is noted. A mild broad-based disc bulge is present. Moderate bilateral facet hypertrophy has progressed. Mild left subarticular and moderate left foraminal stenosis is present. Mild right foraminal is present. L5-S1: Chronic loss of disc height again noted. Facet hypertrophy has progressed foraminal narrowing bilaterally is stable. IMPRESSION: 1. Progressive moderate to severe right and moderate left foraminal stenosis at C6-7 secondary to a broad-based disc osteophyte complex asymmetric to the right. 2. Progressive uncovertebral spurring on the left at C3-4 with moderate left foraminal narrowing. 3. Stable broad-based disc osteophyte complex  and bilateral uncovertebral spurring at C5-6 without significant stenosis. 4. Progressive facet hypertrophy at L1-2 and L3-4 without significant stenosis. 5. Progressive facet hypertrophy at L4-5 with mild left subarticular and moderate left foraminal stenosis. 6. Mild right foraminal stenosis at L4-5. 7. Progressive facet hypertrophy at L5-S1 with progressive mild foraminal narrowing bilaterally. 8. Spinal cord stimulator enters the spinal canal at T10-11. Electronically Signed   By: Marin Roberts M.D.   On: 12/10/2022 16:25   CT CERVICAL SPINE W CONTRAST  Result Date:  12/10/2022 CLINICAL DATA:  Lumbar radiculopathy. Bilateral lower extremity pain in a left S1 distribution and right L5 distribution. Left upper extremity pain radiating to all fingers. FLUOROSCOPY: Radiation Exposure Index (as provided by the fluoroscopic device): Dose area product 441.25 PROCEDURE: LUMBAR PUNCTURE FOR CERVICAL AND LUMBAR MYELOGRAM CERVICAL AND LUMBAR MYELOGRAM CT CERVICAL MYELOGRAM CT LUMBAR MYELOGRAM After thorough discussion of risks and benefits of the procedure including bleeding, infection, injury to nerves, blood vessels, adjacent structures as well as headache and CSF leak, written and oral informed consent was obtained. Consent was obtained by Dr. Marin Roberts. Patient was positioned prone on the fluoroscopy table. Local anesthesia was provided with 1% lidocaine without epinephrine after prepped and draped in the usual sterile fashion. Puncture was performed at L3-4 using a 3 1/2 inch 22-gauge spinal needle via left paramedian approach. Using a single pass through the dura, the needle was placed within the thecal sac, with return of clear CSF. 10 mL of Isovue M-300 was injected into the thecal sac, with normal opacification of the nerve roots and cauda equina consistent with free flow within the subarachnoid space. The patient was then moved to the trendelenburg position and contrast flowed into the Cervical spine region. TECHNIQUE: Contiguous axial images were obtained through the Cervical and Lumbar spine after the intrathecal infusion of infusion. Coronal and sagittal reconstructions were obtained of the axial image sets. COMPARISON:  Total myelogram 05/22/2013 FINDINGS: CERVICAL AND LUMBAR MYELOGRAM FINDINGS: Mild straightening of the normal cervical lordosis is present. Slight retrolisthesis is present at C6-7 with bilateral uncovertebral spurring impacting the central canal and foramina. Slight anterolisthesis is present at L4-5. This is exaggerated with standing. Slight  retrolisthesis is present at L1-2 which partially reduces an flexion. Nerve roots fill normally in the lumbar spine. Spinal cord stimulator is noted. CT CERVICAL MYELOGRAM FINDINGS: In cervical spine is imaged from the skull base to the T2-3 level. Vertebral body heights normal. Slight retrolisthesis is present at C6-7. Craniocervical junction is within normal limits. The visualized intracranial contents are normal. The soft tissues of the neck are unremarkable. The lung apices are clear. The thoracic inlet is within normal limits. C2-3: A shallow central disc protrusion is again noted without significant stenosis or change. C3-4: A mild broad-based disc osteophyte complex is present. Uncovertebral spurring has progressed on the left. Moderate left foraminal narrowing is new. C4-5: A mild broad-based disc osteophyte complex partially effaces the ventral CSF. Mild uncovertebral spurring is worse on the right. No focal stenosis is present. C5-6: A broad-based disc osteophyte complex is present. Uncovertebral spurring present bilaterally. No significant stenosis or change is present. C6-7: Broad-based disc osteophyte complex is asymmetric to the right. Moderate to severe right and moderate left foraminal stenosis is present. CT LUMBAR MYELOGRAM FINDINGS: 5 non rib-bearing lumbar type vertebral bodies are present. The lowest fully formed vertebral body is L5. Slight anterolisthesis is present at L4-5. No other significant listhesis is present. Straightening of the normal lumbar lordosis is present.  Conus medullaris terminates at T12-L1. Limited imaging the abdomen is unremarkable. There is no significant adenopathy. No solid organ lesions are present. Cord stimulator enters the spinal canal at T10-11. T12-L1: Mild facet hypertrophy is present. No significant stenosis or change is present. L1-2: Mild disc bulging and facet hypertrophy are present. Facet hypertrophy is progressed slightly. No significant stenosis is  present. L2-3: Mild disc bulging and facet hypertrophy are present. No significant stenosis is present. L3-4: Mild facet hypertrophy is progressed. No significant disc protrusion or stenosis is present. L4-5: Progressive moderate facet hypertrophy is present. Right laminectomy is noted. A mild broad-based disc bulge is present. Moderate bilateral facet hypertrophy has progressed. Mild left subarticular and moderate left foraminal stenosis is present. Mild right foraminal is present. L5-S1: Chronic loss of disc height again noted. Facet hypertrophy has progressed foraminal narrowing bilaterally is stable. IMPRESSION: 1. Progressive moderate to severe right and moderate left foraminal stenosis at C6-7 secondary to a broad-based disc osteophyte complex asymmetric to the right. 2. Progressive uncovertebral spurring on the left at C3-4 with moderate left foraminal narrowing. 3. Stable broad-based disc osteophyte complex and bilateral uncovertebral spurring at C5-6 without significant stenosis. 4. Progressive facet hypertrophy at L1-2 and L3-4 without significant stenosis. 5. Progressive facet hypertrophy at L4-5 with mild left subarticular and moderate left foraminal stenosis. 6. Mild right foraminal stenosis at L4-5. 7. Progressive facet hypertrophy at L5-S1 with progressive mild foraminal narrowing bilaterally. 8. Spinal cord stimulator enters the spinal canal at T10-11. Electronically Signed   By: Marin Roberts M.D.   On: 12/10/2022 16:25    Procedures Procedures    Medications Ordered in ED Medications  lactated ringers bolus 1,000 mL ( Intravenous Stopped 12/11/22 1342)    ED Course/ Medical Decision Making/ A&P                                 Medical Decision Making Amount and/or Complexity of Data Reviewed Labs: ordered. Radiology: ordered.   Betty Mejia is a 62 y.o. female with comorbidities that complicate the patient evaluation including chronic back pain who presents to the  emergency department presyncope  Initial Ddx:  Vasovagal event, orthostatic hypotension, dehydration, medication side effect, MI, PE  MDM/Course:  Patient presents to the emergency department with a presyncopal event and hypotension at home.  Without any intervention the patient's blood pressures returned to normal.  She is overall very well-appearing.  Was complaining of some chest pain when this happened.  She had an EKG that did not show any signs of arrhythmia including Brugada, long QT, or WPW.  No signs of ischemia and her troponin was normal.  D-dimer was also within normal limits check a chest x-ray that was unremarkable.  Was given a liter of fluids and upon re-evaluation was feeling much improved.  Will have her follow-up with her primary doctor in several days.  Also instructed her to follow-up with cardiology should she experience any similar symptoms or have any other concerns.  Return precautions discussed prior to discharge.  This patient presents to the ED for concern of complaints listed in HPI, this involves an extensive number of treatment options, and is a complaint that carries with it a high risk of complications and morbidity. Disposition including potential need for admission considered.   Dispo: DC Home. Return precautions discussed including, but not limited to, those listed in the AVS. Allowed pt time to ask questions which were  answered fully prior to dc.  Records reviewed Outpatient Clinic Notes The following labs were independently interpreted: Chemistry and show no acute abnormality I independently reviewed the following imaging with scope of interpretation limited to determining acute life threatening conditions related to emergency care: Chest x-ray and agree with the radiologist interpretation with the following exceptions: none I personally reviewed and interpreted cardiac monitoring: normal sinus rhythm  I personally reviewed and interpreted the pt's EKG: see  above for interpretation  I have reviewed the patients home medications and made adjustments as needed   Final Clinical Impression(s) / ED Diagnoses Final diagnoses:  Hypotension, unspecified hypotension type  Dizziness    Rx / DC Orders ED Discharge Orders          Ordered    Ambulatory referral to Cardiology        12/11/22 1324              Rondel Baton, MD 12/11/22 1530

## 2022-12-21 ENCOUNTER — Encounter: Payer: Self-pay | Admitting: General Practice
